# Patient Record
Sex: Female | Born: 1975 | Race: Black or African American | Hispanic: No | Smoking: Never smoker
Health system: Southern US, Community
[De-identification: ages and names within clinical notes are randomized; demographics above are authoritative.]

## PROBLEM LIST (undated history)

## (undated) DIAGNOSIS — IMO0002 Reserved for concepts with insufficient information to code with codable children: Secondary | ICD-10-CM

## (undated) DIAGNOSIS — E0500 Thyrotoxicosis with diffuse goiter without thyrotoxic crisis or storm: Secondary | ICD-10-CM

## (undated) HISTORY — PX: CHOLECYSTECTOMY: SHX55

---

## 2000-06-13 DIAGNOSIS — IMO0002 Reserved for concepts with insufficient information to code with codable children: Secondary | ICD-10-CM

## 2000-06-13 DIAGNOSIS — R87619 Unspecified abnormal cytological findings in specimens from cervix uteri: Secondary | ICD-10-CM

## 2000-06-13 HISTORY — DX: Unspecified abnormal cytological findings in specimens from cervix uteri: R87.619

## 2000-06-13 HISTORY — DX: Reserved for concepts with insufficient information to code with codable children: IMO0002

## 2004-06-13 HISTORY — PX: OOPHORECTOMY: SHX86

## 2009-02-02 ENCOUNTER — Emergency Department (HOSPITAL_BASED_OUTPATIENT_CLINIC_OR_DEPARTMENT_OTHER): Admission: EM | Admit: 2009-02-02 | Discharge: 2009-02-02 | Payer: Self-pay | Admitting: Emergency Medicine

## 2009-02-05 ENCOUNTER — Inpatient Hospital Stay (HOSPITAL_COMMUNITY): Admission: AD | Admit: 2009-02-05 | Discharge: 2009-02-05 | Payer: Self-pay | Admitting: Obstetrics & Gynecology

## 2009-03-11 ENCOUNTER — Encounter: Payer: Self-pay | Admitting: Obstetrics & Gynecology

## 2009-03-11 ENCOUNTER — Ambulatory Visit: Payer: Self-pay | Admitting: Obstetrics and Gynecology

## 2009-03-20 ENCOUNTER — Ambulatory Visit: Payer: Self-pay | Admitting: Obstetrics & Gynecology

## 2009-03-20 LAB — CONVERTED CEMR LAB
Free T4: 2.3 ng/dL — ABNORMAL HIGH (ref 0.80–1.80)
T3, Free: 7.1 pg/mL — ABNORMAL HIGH (ref 2.3–4.2)

## 2009-04-01 ENCOUNTER — Ambulatory Visit: Payer: Self-pay | Admitting: Family Medicine

## 2009-04-01 DIAGNOSIS — E0590 Thyrotoxicosis, unspecified without thyrotoxic crisis or storm: Secondary | ICD-10-CM | POA: Insufficient documentation

## 2009-04-07 ENCOUNTER — Telehealth: Payer: Self-pay | Admitting: *Deleted

## 2009-04-17 ENCOUNTER — Encounter: Payer: Self-pay | Admitting: *Deleted

## 2009-04-30 ENCOUNTER — Telehealth: Payer: Self-pay | Admitting: Family Medicine

## 2009-04-30 ENCOUNTER — Emergency Department (HOSPITAL_BASED_OUTPATIENT_CLINIC_OR_DEPARTMENT_OTHER): Admission: EM | Admit: 2009-04-30 | Discharge: 2009-04-30 | Payer: Self-pay | Admitting: Emergency Medicine

## 2009-05-20 ENCOUNTER — Telehealth: Payer: Self-pay | Admitting: Family Medicine

## 2009-05-26 ENCOUNTER — Encounter: Payer: Self-pay | Admitting: Family Medicine

## 2009-06-01 ENCOUNTER — Ambulatory Visit: Payer: Self-pay | Admitting: Family Medicine

## 2009-06-01 DIAGNOSIS — J029 Acute pharyngitis, unspecified: Secondary | ICD-10-CM

## 2009-06-01 DIAGNOSIS — R110 Nausea: Secondary | ICD-10-CM

## 2009-06-01 DIAGNOSIS — N809 Endometriosis, unspecified: Secondary | ICD-10-CM | POA: Insufficient documentation

## 2009-06-09 ENCOUNTER — Encounter (HOSPITAL_COMMUNITY): Admission: RE | Admit: 2009-06-09 | Discharge: 2009-08-17 | Payer: Self-pay | Admitting: Endocrinology

## 2009-06-13 HISTORY — PX: THYROIDECTOMY: SHX17

## 2009-06-25 ENCOUNTER — Telehealth: Payer: Self-pay | Admitting: Family Medicine

## 2009-07-17 ENCOUNTER — Inpatient Hospital Stay (HOSPITAL_COMMUNITY): Admission: RE | Admit: 2009-07-17 | Discharge: 2009-07-18 | Payer: Self-pay | Admitting: General Surgery

## 2009-09-24 ENCOUNTER — Emergency Department (HOSPITAL_BASED_OUTPATIENT_CLINIC_OR_DEPARTMENT_OTHER): Admission: EM | Admit: 2009-09-24 | Discharge: 2009-09-24 | Payer: Self-pay | Admitting: Emergency Medicine

## 2009-09-24 ENCOUNTER — Ambulatory Visit: Payer: Self-pay | Admitting: Diagnostic Radiology

## 2009-09-28 IMAGING — US US TRANSVAGINAL NON-OB
1 series · 14 of 25 positions shown · non-contrast
Comparison: None

CLINICAL DATA: Vaginal bleeding with abdominal and pelvic pain. LMP
01/22/2009.  The patient is status post left oophorectomy.

TRANSABDOMINAL AND TRANSVAGINAL ULTRASOUND OF PELVIS
TECHNIQUE: Both transabdominal and transvaginal ultrasound
examinations of the pelvis were performed including evaluation of
the uterus, ovaries, adnexal regions, and pelvic cul-de-sac.

[Series 1: us pelvis complete modify · 37 acquisitions, 14 frames shown]
[im 1/37]
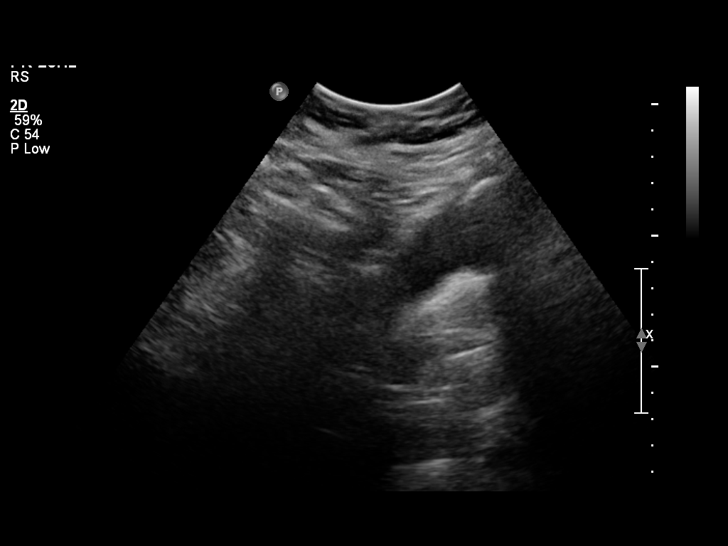
[im 4/37]
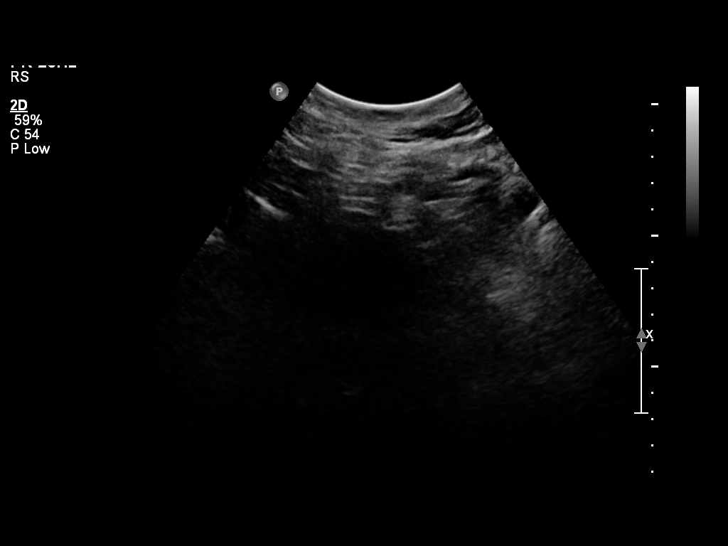
[im 7/37]
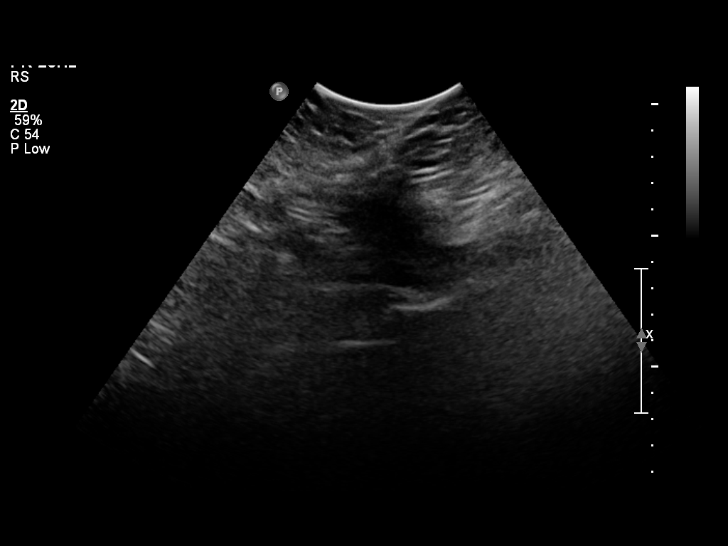
[im 10/37]
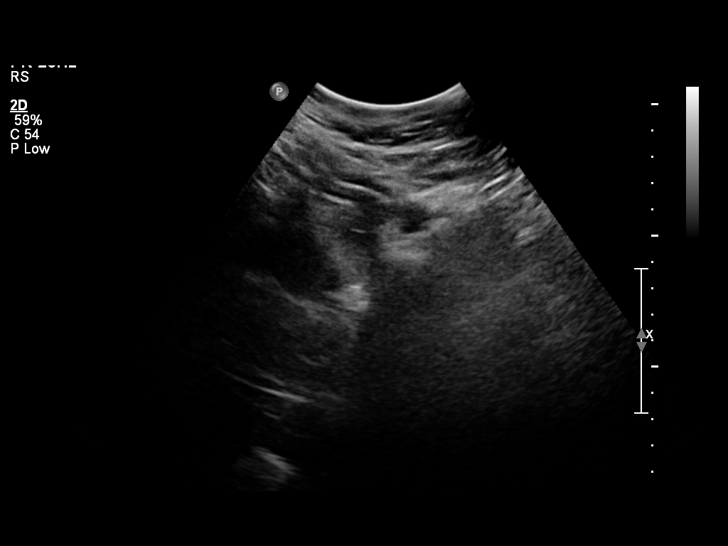
[im 13/37]
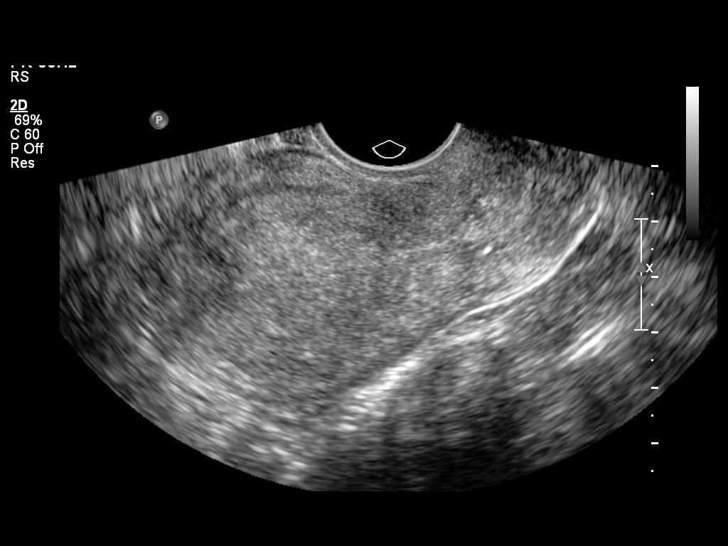
[im 14/37]
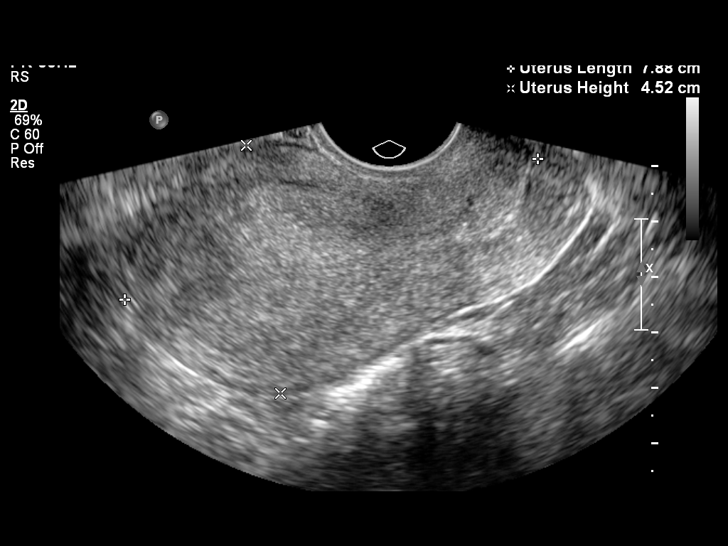
[im 17/37]
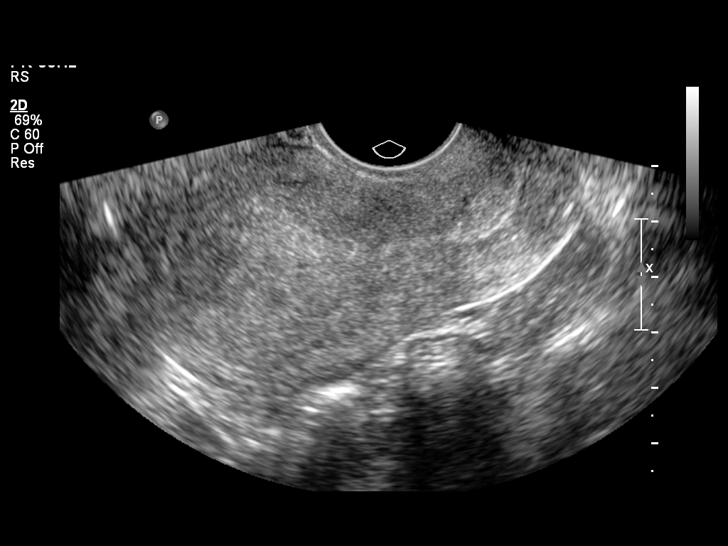
[im 20/37]
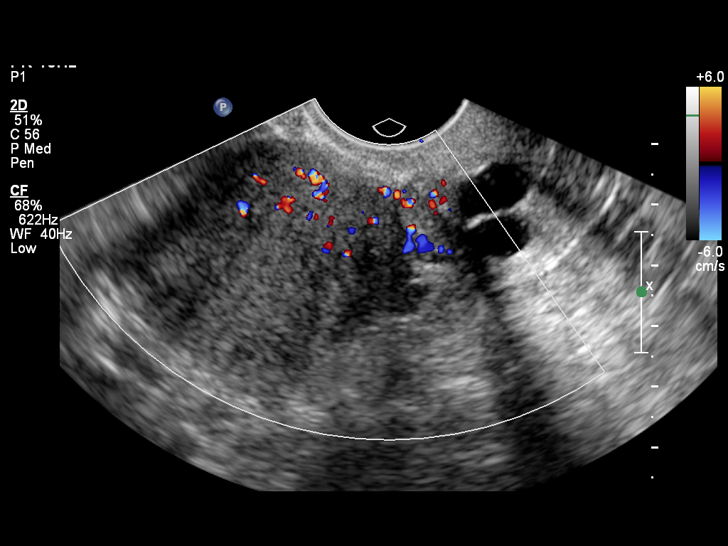
[im 23/37]
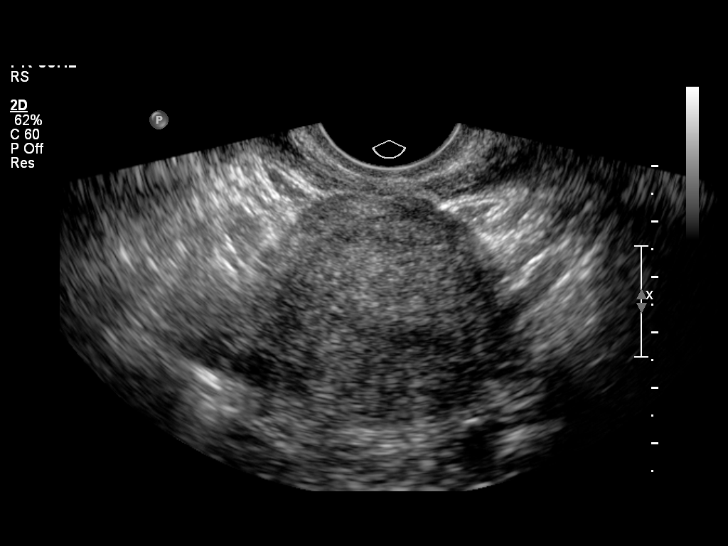
[im 25/37]
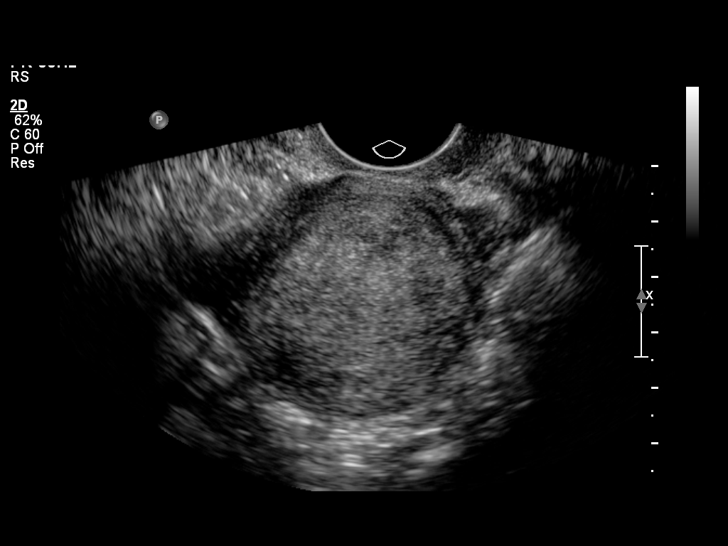
[im 28/37]
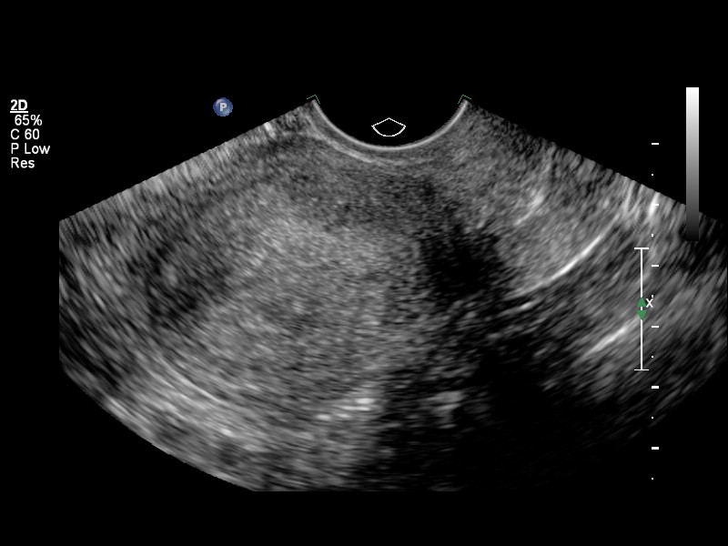
[im 31/37]
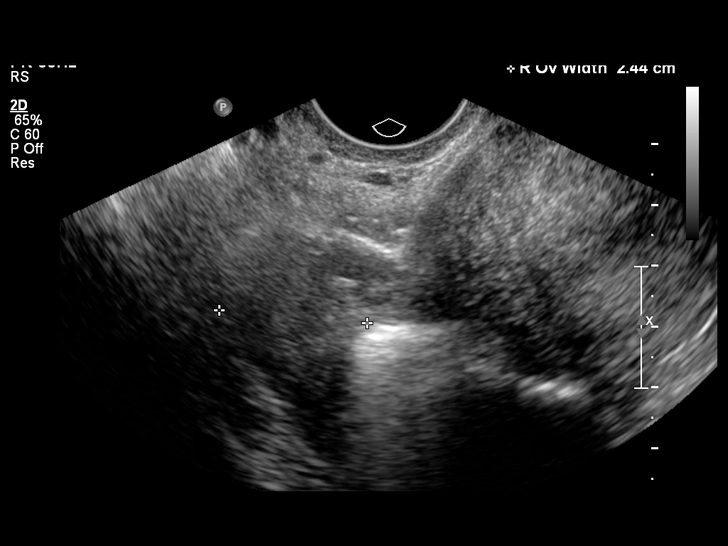
[im 34/37]
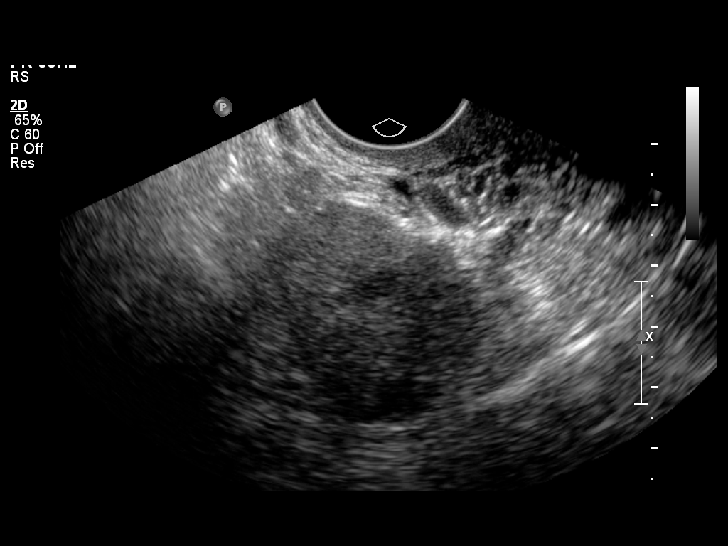
[im 37/37]
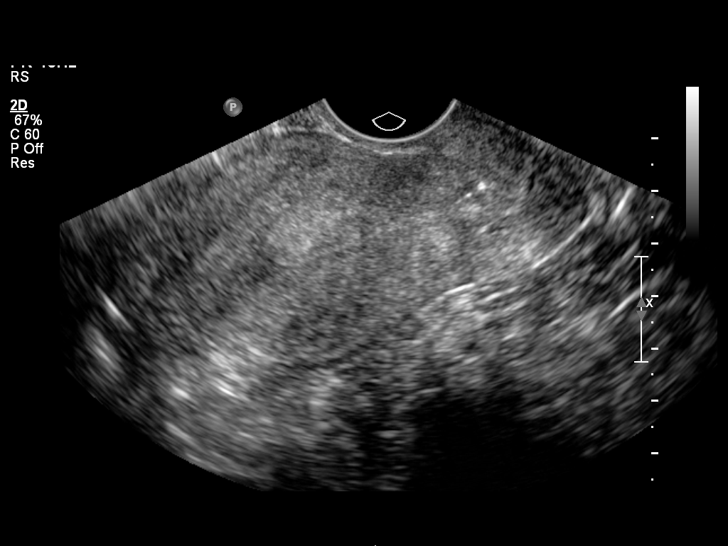

[14 of 25 positions shown; findings below may reference images not displayed]

FINDINGS: Uterus measures a sagittal length of 7.9 cm, an AP depth of 4.5 cm
and a transverse width of 4.8 cm.  The uterine myometrium is mildly
inhomogeneous but demonstrates no shadowing or mural cysts to
suggest adenomyosis otherwise.

Endometrium measures 2.8 mm in thickness.  No focal thickening or
inhomogeneity is seen

Right Ovary measures 2.5 x 3.2 x 2.4 cm and is normal in appearance

Left Ovary surgically absent

Other Findings:  No pelvic fluid is seen.
IMPRESSION: Mild diffuse uterine myometrium inhomogeneity of questionable
significance.  This could represent early changes of adenomyosis.
Normal endometrium and right ovary.

## 2009-12-11 ENCOUNTER — Telehealth: Payer: Self-pay | Admitting: Family Medicine

## 2009-12-15 ENCOUNTER — Telehealth: Payer: Self-pay | Admitting: *Deleted

## 2009-12-21 ENCOUNTER — Telehealth: Payer: Self-pay | Admitting: Family Medicine

## 2010-07-13 NOTE — Progress Notes (Signed)
Summary: cxl appt  Phone Note Call from Patient   Caller: Patient Summary of Call: pt is having a difficult time - her mom is in the hospital and cannot come today Initial call taken by: De Nurse,  December 21, 2009 8:43 AM

## 2010-07-13 NOTE — Progress Notes (Signed)
Summary: meds prob  Phone Note Call from Patient Call back at Home Phone [phone]   Caller: Patient Summary of Call: takes Zofran when she gets nauseous and it is not working - would like something different Initial call taken by: De Nurse,  June 25, 2009 11:53 AM  Follow-up for Phone Call        c/o nausea, vomitting X4 this am, wants phenergan works better for her, saw endocrinologist and had testing done dx with  graves disease ref to surg appt on 19th of Jan, [facility]  [phone] Follow-up by: Gladstone Pih,  June 25, 2009 11:59 AM  Additional Follow-up for Phone Call Additional follow up Details #1::        Sent. Additional Follow-up by: Lamar Laundry, MD January 13th, 2010 12:10PM    Additional Follow-up for Phone Call Additional follow up Details #2::    pt called and notified Rx at [facility], advised to call as needed Follow-up by: Gladstone Pih,  June 25, 2009 12:19 PM  New/Updated Medications: PROMETHAZINE HCL 12.5 MG TABS (PROMETHAZINE HCL) 1 tab by mouth as needed for nausea or vomitting Prescriptions: PROMETHAZINE HCL 12.5 MG TABS (PROMETHAZINE HCL) 1 tab by mouth as needed for nausea or vomitting  #30 x 2   Entered and Authorized by:   Ardeen Garland  MD   Signed by:   Ardeen Garland  MD on 06/25/2009   Method used:   Electronically to        [facility] [phone]* (retail)       [address]       [place], [place]  [address]       Ph: [phone]       Fax: [phone]   RxID:   [id number]

## 2010-07-13 NOTE — Consult Note (Signed)
Summary: [facility]   Imported By: Clydell Hakim 07/08/2009 15:18:02  _____________________________________________________________________  External Attachment:    Type:   Image     Comment:   External Document

## 2010-07-13 NOTE — Progress Notes (Signed)
Summary: phn msg for [facility]  Phone Note Call from Patient   Caller: Patient Summary of Call: Mom admitted to hospital this a.m. Pt not able to make it in today. Initial call taken by: Clydell Hakim,  December 15, 2009 9:48 AM

## 2010-07-13 NOTE — Progress Notes (Signed)
Summary: triage  Phone Note Call from Patient Call back at [phone]   Caller: Patient Summary of Call: Pt is feeling nauseated and thinks it is her synthroid. Initial call taken by: Clydell Hakim,  December 11, 2009 9:29 AM  Follow-up for Phone Call        LM when she calls back, we do not have her on this. she should call Dr. Talmage Nap about this med   had surgery to remove thyroid.  has been increased recently to 200mg  synthroid. nauseous since going up to 200mg . was told to see pcp by Dr. Gilman Schmidt. pt is sure it is the med. md told her it was not. pt unable to come in until next Tuesday. states she is not going to take it. told her she really should keep taking it. appt next Tues with pcp Follow-up by: Golden Circle RN,  December 11, 2009 9:38 AM

## 2010-09-01 LAB — DIFFERENTIAL
Basophils Relative: 1 % (ref 0–1)
Lymphocytes Relative: 47 % — ABNORMAL HIGH (ref 12–46)
Lymphs Abs: 3.5 10*3/uL (ref 0.7–4.0)
Monocytes Relative: 7 % (ref 3–12)
Neutro Abs: 3.3 10*3/uL (ref 1.7–7.7)

## 2010-09-01 LAB — BASIC METABOLIC PANEL
CO2: 25 mEq/L (ref 19–32)
Chloride: 105 mEq/L (ref 96–112)
GFR calc Af Amer: >60 mL/min (ref >60)
GFR calc non Af Amer: >60 mL/min (ref >60)
Sodium: 141 mEq/L (ref 135–145)

## 2010-09-01 LAB — CBC
HCT: 34.6 % — ABNORMAL LOW (ref 36.0–46.0)
MCHC: 33.3 g/dL (ref 30.0–36.0)
MCV: 82.6 fL (ref 78.0–100.0)
RBC: 4.19 MIL/uL (ref 3.87–5.11)
RDW: 17.9 % — ABNORMAL HIGH (ref 11.5–15.5)

## 2010-09-03 LAB — COMPREHENSIVE METABOLIC PANEL
Albumin: 3.6 g/dL (ref 3.5–5.2)
Alkaline Phosphatase: 55 U/L (ref 39–117)
BUN: 5 mg/dL — ABNORMAL LOW (ref 6–23)
CO2: 26 mEq/L (ref 19–32)
Chloride: 102 mEq/L (ref 96–112)
GFR calc Af Amer: >60 mL/min (ref >60)
Potassium: 3.6 mEq/L (ref 3.5–5.1)
Total Bilirubin: 0.8 mg/dL (ref 0.3–1.2)
Total Protein: 7.4 g/dL (ref 6.0–8.3)

## 2010-09-03 LAB — CALCIUM: Calcium: 8.5 mg/dL (ref 8.4–10.5)

## 2010-09-15 LAB — URINALYSIS, ROUTINE W REFLEX MICROSCOPIC
Glucose, UA: NEGATIVE mg/dL
Ketones, ur: NEGATIVE mg/dL
Nitrite: NEGATIVE
Urobilinogen, UA: 0.2 mg/dL (ref 0.0–1.0)
pH: 5.5 (ref 5.0–8.0)

## 2010-09-15 LAB — DIFFERENTIAL
Eosinophils Absolute: 0 10*3/uL (ref 0.0–0.7)
Eosinophils Relative: 1 % (ref 0–5)
Lymphs Abs: 2.1 10*3/uL (ref 0.7–4.0)
Monocytes Absolute: 0.4 10*3/uL (ref 0.1–1.0)
Monocytes Relative: 4 % (ref 3–12)
Neutrophils Relative %: 72 % (ref 43–77)

## 2010-09-15 LAB — COMPREHENSIVE METABOLIC PANEL
AST: 24 U/L (ref 0–37)
Albumin: 4 g/dL (ref 3.5–5.2)
CO2: 23 mEq/L (ref 19–32)
Calcium: 9 mg/dL (ref 8.4–10.5)
Chloride: 107 mEq/L (ref 96–112)
GFR calc non Af Amer: >60 mL/min (ref >60)
Glucose, Bld: 106 mg/dL — ABNORMAL HIGH (ref 70–99)
Total Protein: 7.6 g/dL (ref 6.0–8.3)

## 2010-09-15 LAB — CBC
Hemoglobin: 11.9 g/dL — ABNORMAL LOW (ref 12.0–15.0)
RBC: 4.64 MIL/uL (ref 3.87–5.11)
WBC: 9.9 10*3/uL (ref 4.0–10.5)

## 2010-09-15 LAB — PREGNANCY, URINE: Preg Test, Ur: NEGATIVE

## 2010-09-18 LAB — URINALYSIS, ROUTINE W REFLEX MICROSCOPIC
Hgb urine dipstick: NEGATIVE
Nitrite: NEGATIVE
Specific Gravity, Urine: 1.023 (ref 1.005–1.030)
Urobilinogen, UA: 1 mg/dL (ref 0.0–1.0)

## 2010-09-18 LAB — BASIC METABOLIC PANEL
BUN: 11 mg/dL (ref 6–23)
CO2: 24 mEq/L (ref 19–32)
Chloride: 106 mEq/L (ref 96–112)
Glucose, Bld: 126 mg/dL — ABNORMAL HIGH (ref 70–99)
Potassium: 3.7 mEq/L (ref 3.5–5.1)

## 2010-09-18 LAB — WET PREP, GENITAL
Trich, Wet Prep: NONE SEEN
Yeast Wet Prep HPF POC: NONE SEEN

## 2010-09-18 LAB — DIFFERENTIAL
Eosinophils Absolute: 0.1 10*3/uL (ref 0.0–0.7)
Monocytes Absolute: 0.4 10*3/uL (ref 0.1–1.0)
Neutro Abs: 4.1 10*3/uL (ref 1.7–7.7)
Neutrophils Relative %: 52 % (ref 43–77)

## 2010-09-18 LAB — CBC
HCT: 33.9 % — ABNORMAL LOW (ref 36.0–46.0)
HCT: 36.1 % (ref 36.0–46.0)
MCHC: 32.4 g/dL (ref 30.0–36.0)
MCV: 76.8 fL — ABNORMAL LOW (ref 78.0–100.0)
Platelets: 242 10*3/uL (ref 150–400)
Platelets: 242 10*3/uL (ref 150–400)
RDW: 15.3 % (ref 11.5–15.5)

## 2010-09-18 LAB — PREGNANCY, URINE: Preg Test, Ur: NEGATIVE

## 2010-12-24 ENCOUNTER — Inpatient Hospital Stay (HOSPITAL_COMMUNITY): Payer: BC Managed Care – PPO

## 2010-12-24 ENCOUNTER — Inpatient Hospital Stay (HOSPITAL_COMMUNITY)
Admission: AD | Admit: 2010-12-24 | Discharge: 2010-12-24 | Disposition: A | Discharge status: Home / Self Care | Payer: BC Managed Care – PPO | Source: Ambulatory Visit | Attending: Obstetrics and Gynecology | Admitting: Obstetrics and Gynecology

## 2010-12-24 ENCOUNTER — Encounter (HOSPITAL_COMMUNITY): Payer: Self-pay

## 2010-12-24 DIAGNOSIS — N949 Unspecified condition associated with female genital organs and menstrual cycle: Secondary | ICD-10-CM | POA: Insufficient documentation

## 2010-12-24 DIAGNOSIS — R102 Pelvic and perineal pain: Secondary | ICD-10-CM

## 2010-12-24 DIAGNOSIS — R52 Pain, unspecified: Secondary | ICD-10-CM

## 2010-12-24 HISTORY — DX: Reserved for concepts with insufficient information to code with codable children: IMO0002

## 2010-12-24 HISTORY — DX: Thyrotoxicosis with diffuse goiter without thyrotoxic crisis or storm: E05.00

## 2010-12-24 LAB — URINALYSIS, ROUTINE W REFLEX MICROSCOPIC
Bilirubin Urine: NEGATIVE
Glucose, UA: NEGATIVE mg/dL
Ketones, ur: NEGATIVE mg/dL
pH: 6 (ref 5.0–8.0)

## 2010-12-24 LAB — COMPREHENSIVE METABOLIC PANEL
Alkaline Phosphatase: 56 U/L (ref 39–117)
BUN: 4 mg/dL — ABNORMAL LOW (ref 6–23)
CO2: 24 mEq/L (ref 19–32)
GFR calc Af Amer: >60 mL/min (ref >60)
GFR calc non Af Amer: >60 mL/min (ref >60)
Glucose, Bld: 106 mg/dL — ABNORMAL HIGH (ref 70–99)
Potassium: 3.8 mEq/L (ref 3.5–5.1)
Total Bilirubin: 0.5 mg/dL (ref 0.3–1.2)
Total Protein: 7.8 g/dL (ref 6.0–8.3)

## 2010-12-24 LAB — CBC
Hemoglobin: 10.8 g/dL — ABNORMAL LOW (ref 12.0–15.0)
MCH: 25.6 pg — ABNORMAL LOW (ref 26.0–34.0)
MCV: 81.3 fL (ref 78.0–100.0)
RBC: 4.22 MIL/uL (ref 3.87–5.11)

## 2010-12-24 LAB — DIFFERENTIAL
Eosinophils Absolute: 0.1 10*3/uL (ref 0.0–0.7)
Eosinophils Relative: 1 % (ref 0–5)
Lymphocytes Relative: 46 % (ref 12–46)
Lymphs Abs: 3.4 10*3/uL (ref 0.7–4.0)
Monocytes Relative: 7 % (ref 3–12)
Neutrophils Relative %: 46 % (ref 43–77)

## 2010-12-24 LAB — WET PREP, GENITAL: Trich, Wet Prep: NONE SEEN

## 2010-12-24 MED ORDER — HYDROMORPHONE HCL 1 MG/ML IJ SOLN
1.0000 mg | Freq: Once | INTRAMUSCULAR | Status: AC
  Administered 2010-12-24: 1 mg via INTRAVENOUS
  Filled 2010-12-24: qty 1

## 2010-12-24 MED ORDER — SODIUM CHLORIDE 0.9 % IV SOLN: INTRAVENOUS | Status: DC

## 2010-12-24 MED ORDER — SODIUM CHLORIDE 0.9 % IV SOLN
Freq: Once | INTRAVENOUS | Status: AC
  Administered 2010-12-24: 15:00:00 via INTRAVENOUS

## 2010-12-24 MED ORDER — KETOROLAC TROMETHAMINE 10 MG PO TABS: 10.0000 mg | ORAL_TABLET | Freq: Four times a day (QID) | ORAL | Status: AC | PRN

## 2010-12-24 MED ORDER — PROMETHAZINE HCL 25 MG PO TABS: 25.0000 mg | ORAL_TABLET | Freq: Four times a day (QID) | ORAL | Status: DC | PRN

## 2010-12-24 MED ORDER — PROMETHAZINE HCL 25 MG/ML IJ SOLN
12.5000 mg | Freq: Once | INTRAMUSCULAR | Status: AC
  Administered 2010-12-24: 12.5 mg via INTRAVENOUS
  Filled 2010-12-24: qty 1

## 2010-12-24 MED ORDER — KETOROLAC TROMETHAMINE 30 MG/ML IJ SOLN
30.0000 mg | Freq: Once | INTRAMUSCULAR | Status: AC
  Administered 2010-12-24: 30 mg via INTRAVENOUS
  Filled 2010-12-24: qty 1

## 2010-12-24 MED ORDER — ONDANSETRON HCL 4 MG/2ML IJ SOLN
4.0000 mg | Freq: Once | INTRAMUSCULAR | Status: AC
  Administered 2010-12-24: 4 mg via INTRAVENOUS
  Filled 2010-12-24: qty 2

## 2010-12-24 NOTE — ED Provider Notes (Signed)
History     Chief Complaint  Patient presents with  . Pelvic Pain   The history is provided by the patient.    OB History    Grav Para Term Preterm Abortions TAB SAB Ect Mult Living   3 2   1  1   2       Past Medical History  Diagnosis Date  . Grave's disease   . Abnormal Pap smear 2002    mild dysplasia  . Endometriosis     Past Surgical History  Procedure Date  . Thyroidectomy 2011  . Oophorectomy 2006    left ovary    Family History  Problem Relation Age of Onset  . Heart disease Mother   . Heart disease Father   . Early death Father     History  Substance Use Topics  . Smoking status: Never Smoker   . Smokeless tobacco: Never Used  . Alcohol Use: Yes     socially    Allergies:  Allergies  Allergen Reactions  . Codeine Anaphylaxis    Prescriptions prior to admission  Medication Sig Dispense Refill  . promethazine (PHENERGAN) 12.5 MG tablet Take 12.5 mg by mouth as needed. For nausea and vomitting        . zolpidem (AMBIEN) 5 MG tablet Take 5 mg by mouth at bedtime as needed.          Review of Systems  Constitutional: Positive for chills and malaise/fatigue. Negative for fever.  Eyes: Negative for blurred vision and double vision.  Cardiovascular: Negative for chest pain and palpitations.  Gastrointestinal: Positive for nausea and abdominal pain.  Genitourinary: Negative for dysuria, urgency and frequency.  Musculoskeletal: Positive for back pain.  Skin: Negative.   Neurological: Positive for dizziness, tingling and headaches.  Psychiatric/Behavioral: Negative for depression.  GU; Pelvic pain that is severe Physical Exam   Temperature 98.3 F (36.8 C), temperature source Oral, resp. rate 18, last menstrual period 11/23/2010.  Physical Exam  General Appearance:    Alert, cooperative, no distress, appears stated age  Head:    Normocephalic, without obvious abnormality, atraumatic  Back:     Symmetric, no curvature, ROM normal, no CVA  tenderness  Lungs:      respirations unlabored  Genitalia:    Normal female without lesions, thick white d/c. +CMT, left adnexal tenderness, no palpable enlargement of the uterus.  Abdomen: Soft, tender on palpation LLQ, no rebound or guarding.   Extremities:   Extremities normal, atraumatic, no cyanosis or edema  Skin:   Skin color, texture, turgor normal, no rashes or lesions  Neurologic: Alert and oriented, normal gait.    MAU Course  Procedures  MDM    Kerrie Buffalo, NP 12/24/10 2029

## 2010-12-24 NOTE — ED Provider Notes (Signed)
Patient has received I.v. Dilaudid and Toradol with some relief, and some dizziness attributed to the i.v. Dilaudid.  Patient desires to try going home on other analgesic plus antiemetic plus p.o toradol.  Pt desires to return to work Monday and will receive note confirming this to be allowed.  Followup Tuesday with DR. Neva Seat in [place].

## 2010-12-24 NOTE — ED Notes (Signed)
Dr. Emelda Fear at pt. bedside with Mayer Camel, NP.

## 2010-12-24 NOTE — Progress Notes (Signed)
Pt transported to ultrasound. Ultrasound tech informed that pt had been given pain medicine.

## 2010-12-24 NOTE — Progress Notes (Addendum)
Patient gives a reliable history of progressive llq pain worsening as menses approaches, with menses due soon. History of left salping oophorectomy as well as 3 laparoscopies for endometriosis.  Pain has previously been mainly AFTER her menses. Phys Ex notable for normal bowel sounds, soft abdomen, and pain in anterior thigh with leg lift , less with external rotation. No pain in back of leg.  Calf and thigh normal to palpation. Pelvic not repeated, but ultrasound indicates tissue in area of left adnexa, suggestive of recurrent endometriosis. Pt also c/o dizziness earlier today, and mouth dry.  Poor PO intake today. Normal BM earlier.  Imp:  Recurrent endometriosis with referred pain into thigh. Mild dehydration and assoc dizziness .\  Plan:  Tx with i.v. Toradol 30 mg I.v. And Dilaudid 1 mg repeat dose, and reassess.     Goal : pain control until follow up appt with Dr. Neva Seat in [facility].  See note in Progress notes:  Patient slightly improved, desires to go home and follow up next week with dr Neva Seat.

## 2010-12-25 LAB — GC/CHLAMYDIA PROBE AMP, GENITAL: GC Probe Amp, Genital: NEGATIVE

## 2011-08-16 IMAGING — US US PELVIS COMPLETE
1 series · 13 of 25 positions shown · non-contrast
Comparison: 02/05/2009

CLINICAL DATA: Left lower quadrant pain radiating to the back with
leg numbness and dark urine.  Endometriosis.  LMP 11/25/2010

TRANSABDOMINAL AND TRANSVAGINAL ULTRASOUND OF PELVIS
TECHNIQUE: Both transabdominal and transvaginal ultrasound
examinations of the pelvis were performed. Transabdominal technique
was performed for global imaging of the pelvis including uterus,
ovaries, adnexal regions, and pelvic cul-de-sac.

[Series 1: us pelvis complete · 47 acquisitions, 13 frames shown]
[im 1/47]
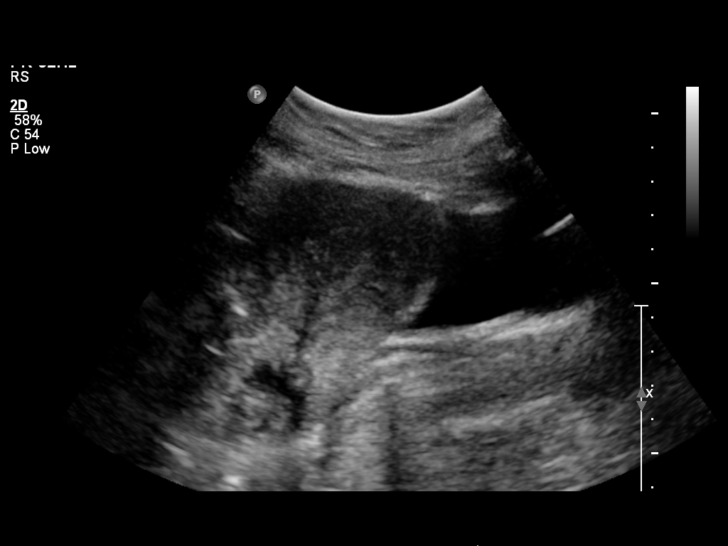
[im 4/47]
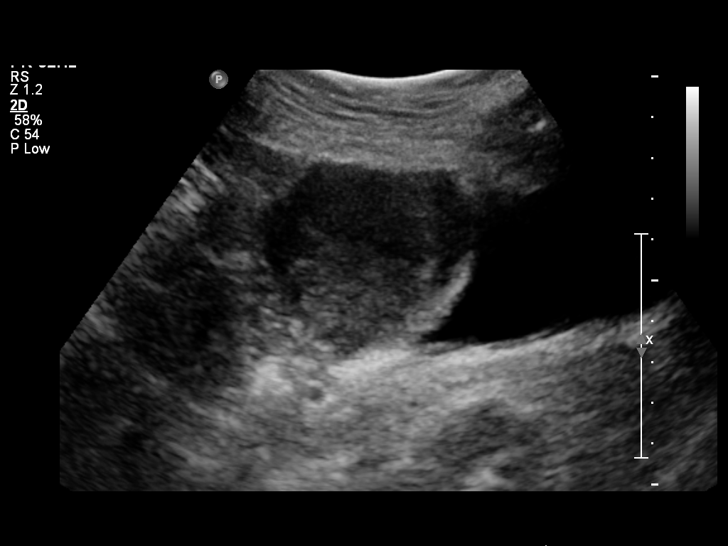
[im 8/47]
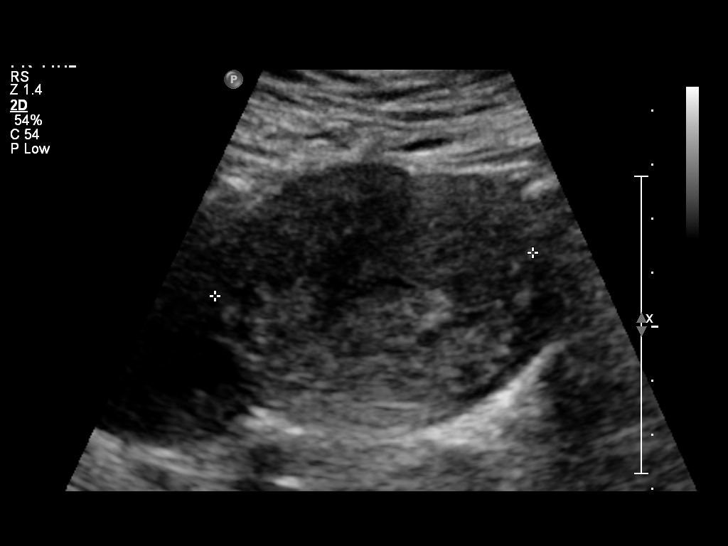
[im 12/47]
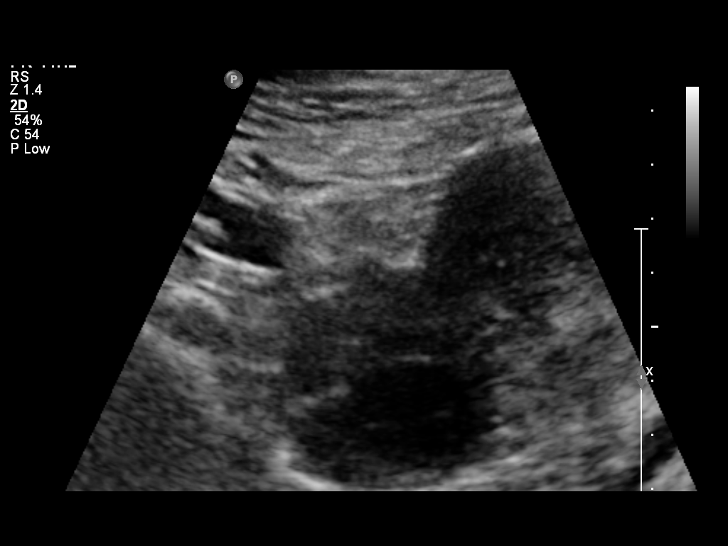
[im 16/47]
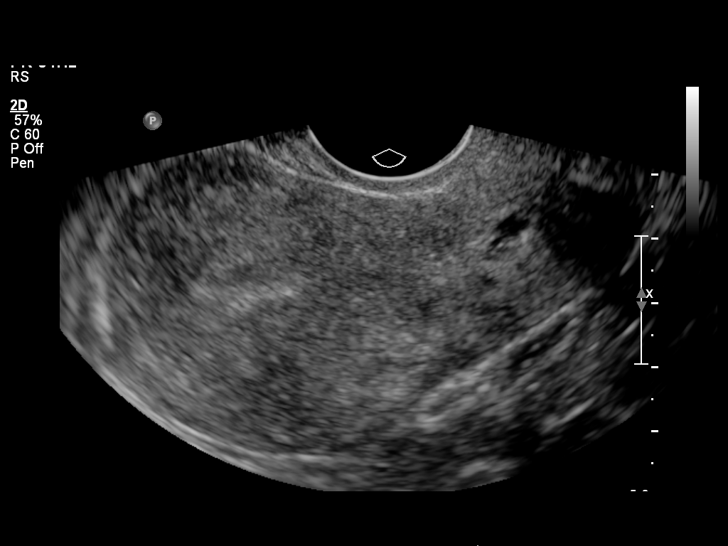
[im 20/47]
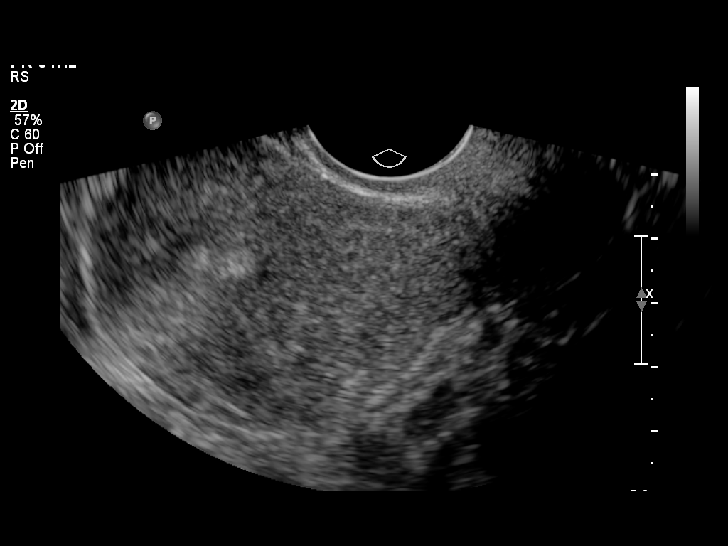
[im 24/47]
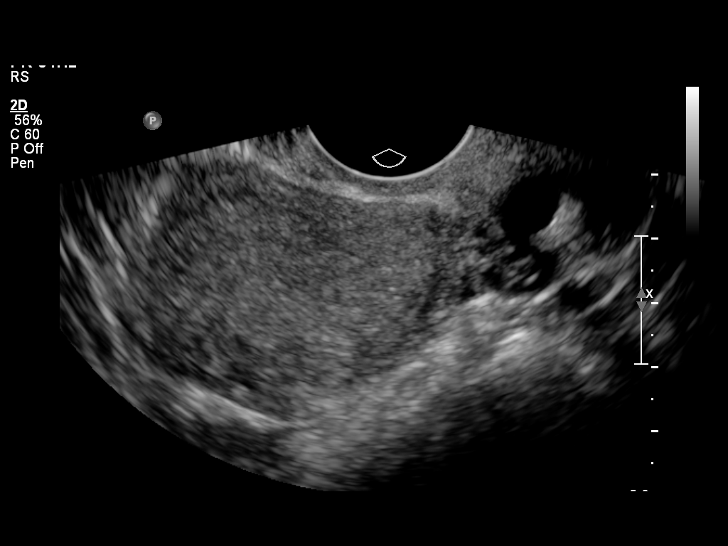
[im 27/47]
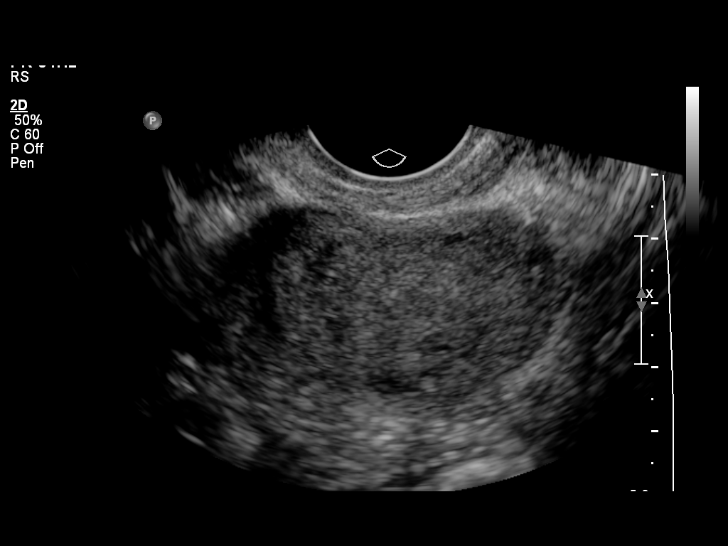
[im 31/47]
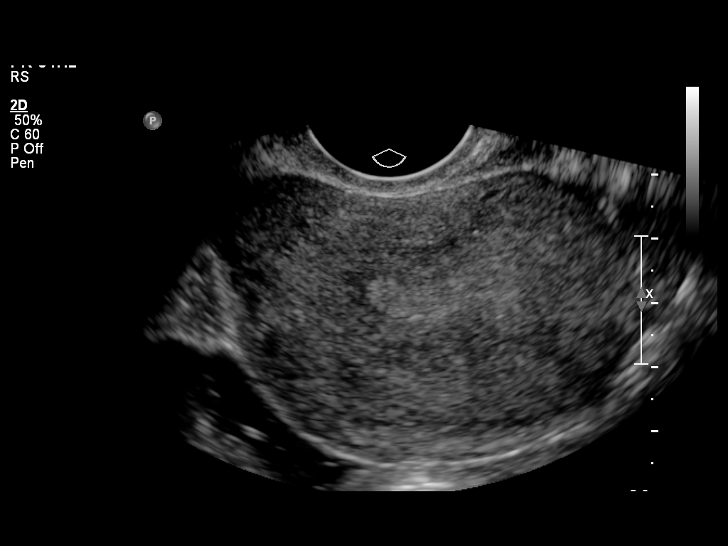
[im 35/47]
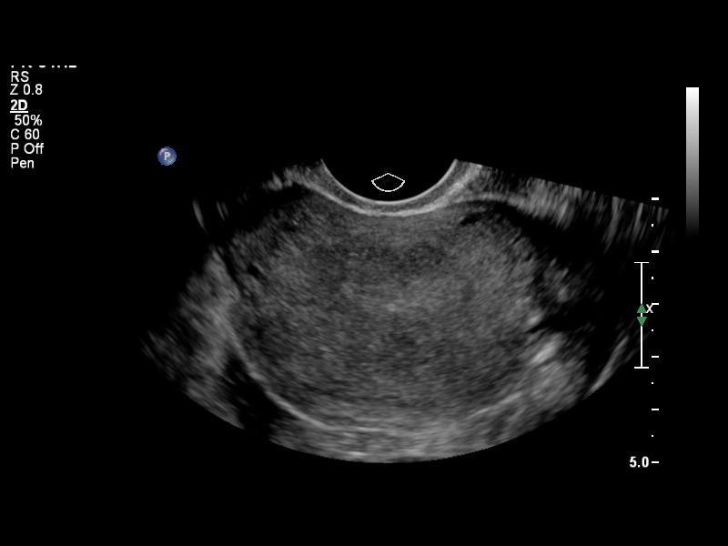
[im 39/47]
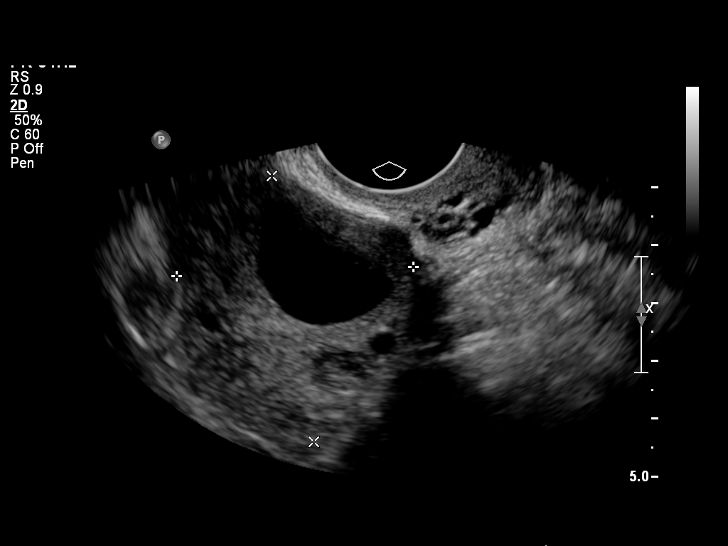
[im 43/47]
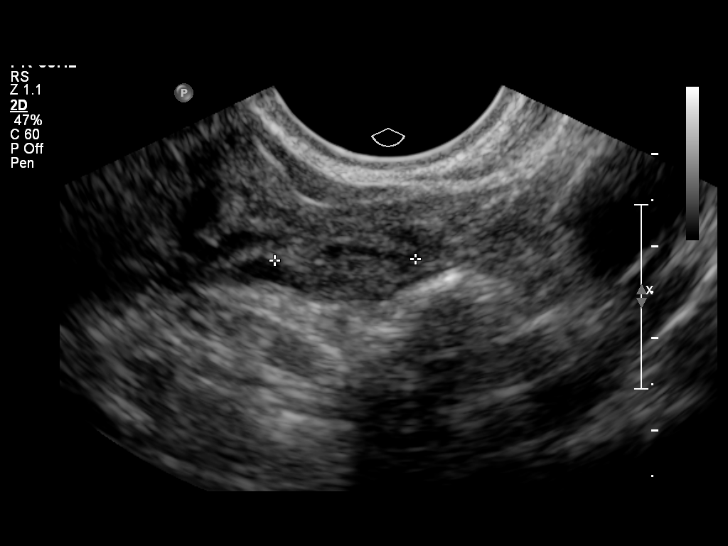
[im 47/47]
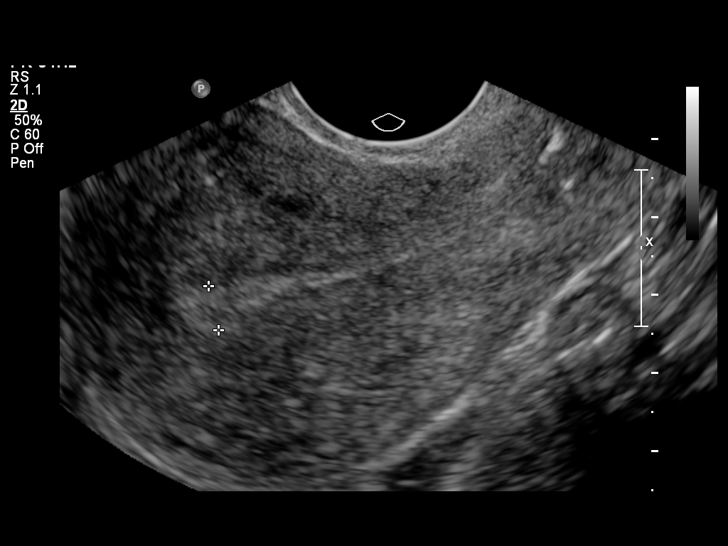

[13 of 25 positions shown; findings below may reference images not displayed]

It was necessary to proceed with endovaginal exam following the
transabdomnial exam to visualize the endometrium and ovaries.
FINDINGS: Uterus: The uterus demonstrates a sagittal length of 8.0 cm, AP
depth of 5.1 cm and a transverse width 6.1 cm.  The uterine
myometrium is mildly heterogeneous with no focal abnormality.

Endometrium: Is homogeneously echogenic with an AP width of 5.8 mm.
No areas of focal thickening or heterogeneity are seen

Right ovary:  Measures 3.7 by a 4.1 x 4.7 cm and contains a thin
walled cystic area measuring 3.2 by 2.4 x 2.0 cm which is either a
single cyst with a solitary thin septation measuring 1.7mm or two
closely apposed follicles.  No worrisome features are seen and this
is likely a benign or functional process.

Left ovary: Measures 1.5 by by 1.5 x 0.8 cm and has a normal
appearance.

Other findings: No pelvic fluid or separate adnexal masses are
seen.  No soft tissue nodularity is identified to suggest
endometrial implants in this patient with known endometriosis.
IMPRESSION: Mild myometrial heterogeneity with no focal abnormality or
ancillary ultrasound findings to suggest underlying adenomyosis.

Thin endometrial stripe and normal left ovary.

Thin walled right ovarian cyst with solitary thin septation versus
two adjacent closely apposed follicles.  This is likely functional
but follow-up is recommended in the postsecretory phase of the
cycle following the next complete cycle to confirm resolution.

No explanation for the patient's severe pelvic pain is identified.

## 2012-04-08 ENCOUNTER — Emergency Department (HOSPITAL_BASED_OUTPATIENT_CLINIC_OR_DEPARTMENT_OTHER)
Admission: EM | Admit: 2012-04-08 | Discharge: 2012-04-08 | Disposition: A | Discharge status: Home / Self Care | Payer: BC Managed Care – PPO | Attending: Emergency Medicine | Admitting: Emergency Medicine

## 2012-04-08 ENCOUNTER — Encounter (HOSPITAL_BASED_OUTPATIENT_CLINIC_OR_DEPARTMENT_OTHER): Payer: Self-pay | Admitting: *Deleted

## 2012-04-08 DIAGNOSIS — Z79899 Other long term (current) drug therapy: Secondary | ICD-10-CM | POA: Insufficient documentation

## 2012-04-08 DIAGNOSIS — Z8639 Personal history of other endocrine, nutritional and metabolic disease: Secondary | ICD-10-CM | POA: Insufficient documentation

## 2012-04-08 DIAGNOSIS — E0500 Thyrotoxicosis with diffuse goiter without thyrotoxic crisis or storm: Secondary | ICD-10-CM | POA: Insufficient documentation

## 2012-04-08 DIAGNOSIS — N809 Endometriosis, unspecified: Secondary | ICD-10-CM | POA: Insufficient documentation

## 2012-04-08 DIAGNOSIS — Z862 Personal history of diseases of the blood and blood-forming organs and certain disorders involving the immune mechanism: Secondary | ICD-10-CM | POA: Insufficient documentation

## 2012-04-08 DIAGNOSIS — Z9079 Acquired absence of other genital organ(s): Secondary | ICD-10-CM | POA: Insufficient documentation

## 2012-04-08 DIAGNOSIS — M461 Sacroiliitis, not elsewhere classified: Secondary | ICD-10-CM | POA: Insufficient documentation

## 2012-04-08 DIAGNOSIS — Z8742 Personal history of other diseases of the female genital tract: Secondary | ICD-10-CM | POA: Insufficient documentation

## 2012-04-08 DIAGNOSIS — R109 Unspecified abdominal pain: Secondary | ICD-10-CM | POA: Insufficient documentation

## 2012-04-08 LAB — PREGNANCY, URINE: Preg Test, Ur: NEGATIVE

## 2012-04-08 LAB — URINALYSIS, ROUTINE W REFLEX MICROSCOPIC
Bilirubin Urine: NEGATIVE
Glucose, UA: NEGATIVE mg/dL
Hgb urine dipstick: NEGATIVE
Specific Gravity, Urine: 1.026 (ref 1.005–1.030)
Urobilinogen, UA: 0.2 mg/dL (ref 0.0–1.0)

## 2012-04-08 LAB — URINE MICROSCOPIC-ADD ON

## 2012-04-08 MED ORDER — GI COCKTAIL ~~LOC~~
30.0000 mL | Freq: Once | ORAL | Status: AC
  Administered 2012-04-08: 30 mL via ORAL
  Filled 2012-04-08: qty 30

## 2012-04-08 MED ORDER — HYDROCODONE-ACETAMINOPHEN 5-325 MG PO TABS: 1.0000 | ORAL_TABLET | Freq: Four times a day (QID) | ORAL | Status: DC | PRN

## 2012-04-08 MED ORDER — NAPROXEN SODIUM 220 MG PO TABS: ORAL_TABLET | ORAL | Status: DC

## 2012-04-08 NOTE — ED Provider Notes (Signed)
History   This chart was scribed for Hanley Seamen, MD by Sofie Rower. The patient was seen in room MH04/MH04 and the patient's care was started at 11:04PM.     CSN: [id number]  Arrival date & time 04/08/12  2122   First MD Initiated Contact with Patient 04/08/12 2304      Chief Complaint  Patient presents with  . Back Pain    (Consider location/radiation/quality/duration/timing/severity/associated sxs/prior treatment) Patient is a 36 y.o. female presenting with back pain. The history is provided by the patient. No language interpreter was used.  Back Pain  This is a new problem. The current episode started more than 2 days ago (One week ago. ). The problem occurs constantly. The problem has been gradually worsening. The pain is associated with no known injury. The pain is present in the sacro-iliac joint. The quality of the pain is described as stabbing and shooting. The pain radiates to the left thigh. The pain is moderate. The symptoms are aggravated by certain positions and bending. The pain is the same all the time. Associated symptoms include chest pain, abdominal pain and tingling. Pertinent negatives include no dysuria. She has tried NSAIDs for the symptoms. The treatment provided no relief.    Patricia Nolan is a 35 y.o. female who presents to the Emergency Department complaining of  sudden, progressively worsening, back pain located at the left lower back, radiating towards the left lower extremity, onset one week ago.  Associated symptoms include chest pain, groin pain located at the left groin and numbness located at the left lower extremity. Modifying factors include walking which intensifies the back and leg pain and deep breathing which intensifies the chest pain. The pt has a hx of graves disease, endometriosis, and thyroidectomy.   The pt denies dysuria, fever, chills, nausea, vomiting, and diarrhea.   The pt does not smoke, however, she does drink alcohol socially.   PCP is  Dr. Julio Sicks.    Past Medical History  Diagnosis Date  . Grave's disease   . Abnormal Pap smear 2002    mild dysplasia  . Endometriosis     Past Surgical History  Procedure Date  . Thyroidectomy 2011  . Oophorectomy 2006    left ovary    Family History  Problem Relation Age of Onset  . Heart disease Mother   . Heart disease Father   . Early death Father     History  Substance Use Topics  . Smoking status: Never Smoker   . Smokeless tobacco: Never Used  . Alcohol Use: Yes     socially    OB History    Grav Para Term Preterm Abortions TAB SAB Ect Mult Living   3 2 2  1  1   2       Review of Systems  Cardiovascular: Positive for chest pain.  Gastrointestinal: Positive for abdominal pain.  Genitourinary: Negative for dysuria.  Musculoskeletal: Positive for back pain.  Neurological: Positive for tingling.  All other systems reviewed and are negative.    Allergies  Codeine  Home Medications   Current Outpatient Rx  Name Route Sig Dispense Refill  . IBUPROFEN 800 MG PO TABS Oral Take 800 mg by mouth every 8 (eight) hours as needed.    Marland Kitchen DIMENHYDRINATE 50 MG PO TABS Oral Take 50 mg by mouth every 8 (eight) hours as needed.      Marland Kitchen LEVOTHYROXINE SODIUM 112 MCG PO TABS Oral Take 112 mcg by mouth daily.      Marland Kitchen  PROMETHAZINE HCL 12.5 MG PO TABS Oral Take 12.5 mg by mouth as needed. For nausea and vomitting      . PROMETHAZINE HCL 25 MG PO TABS Oral Take 1 tablet (25 mg total) by mouth every 6 (six) hours as needed for nausea. 30 tablet 0  . ZOLPIDEM TARTRATE 5 MG PO TABS Oral Take 5 mg by mouth at bedtime as needed.        BP 147/80  Pulse 101  Temp 98.7 F (37.1 C) (Oral)  Resp 16  SpO2 100%  LMP 04/01/2012  Physical Exam  Nursing note and vitals reviewed. Constitutional: She is oriented to person, place, and time. She appears well-developed and well-nourished.  HENT:  Head: Atraumatic.  Nose: Nose normal.  Eyes: Conjunctivae normal and EOM are  normal. Pupils are equal, round, and reactive to light.  Neck: Normal range of motion. Neck supple.  Cardiovascular: Normal rate, regular rhythm and normal heart sounds.   Pulmonary/Chest: Effort normal and breath sounds normal.  Abdominal: Soft. Bowel sounds are normal.  Musculoskeletal: Normal range of motion. She exhibits tenderness.       Left sacro illiac tenderness. Left groin tenderness.   Neurological: She is alert and oriented to person, place, and time.  Skin: Skin is warm and dry.  Psychiatric: She has a normal mood and affect. Her behavior is normal.    ED Course  Procedures (including critical care time)  DIAGNOSTIC STUDIES: Oxygen Saturation is 100% on room air, normal by my interpretation.    COORDINATION OF CARE:    11:11 PM- Treatment plan concerning pain management discussed with patient. Pt agrees with treatment.     MDM   Nursing notes and vitals signs, including pulse oximetry, reviewed.  Summary of this visit's results, reviewed by myself:  Labs:  Results for orders placed during the hospital encounter of 04/08/12  PREGNANCY, URINE      Component Value Range   Preg Test, Ur NEGATIVE  NEGATIVE  URINALYSIS, ROUTINE W REFLEX MICROSCOPIC      Component Value Range   Color, Urine YELLOW  YELLOW   APPearance CLOUDY (*) CLEAR   Specific Gravity, Urine 1.026  1.005 - 1.030   pH 6.0  5.0 - 8.0   Glucose, UA NEGATIVE  NEGATIVE mg/dL   Hgb urine dipstick NEGATIVE  NEGATIVE   Bilirubin Urine NEGATIVE  NEGATIVE   Ketones, ur NEGATIVE  NEGATIVE mg/dL   Protein, ur NEGATIVE  NEGATIVE mg/dL   Urobilinogen, UA 0.2  0.0 - 1.0 mg/dL   Nitrite POSITIVE (*) NEGATIVE   Leukocytes, UA SMALL (*) NEGATIVE  URINE MICROSCOPIC-ADD ON      Component Value Range   Squamous Epithelial / LPF MANY (*) RARE   WBC, UA 3-6  <3 WBC/hpf   RBC / HPF 0-2  <3 RBC/hpf   Bacteria, UA MANY (*) RARE     Date: 04/08/2012 9:42 PM  Rate: 98  Rhythm: normal sinus rhythm  QRS Axis:  normal  Intervals: normal  ST/T Wave abnormalities: normal  Conduction Disutrbances: none  Narrative Interpretation: unremarkable  Comparison with previous EKG:   11:29 PM Patient states she continues to have a burning sensation when she swallows even after taking a GI cocktail. This appears to be consistent with acid reflux due to to high-dose ibuprofen. We will have her switch to Aleve and advised she take it with meals. Her history and exam are consistent with sacroiliitis.         I personally  performed the services described in this documentation, which was scribed in my presence.  The recorded information has been reviewed and considered.    Hanley Seamen, MD 04/08/12 2329

## 2012-04-08 NOTE — ED Notes (Signed)
Pt reports chest pain worse with inspiration radiating to back since 6 pm- pt reports LLQ abd pain x 1 week- states left leg "feels like its falling asleep"

## 2012-04-10 LAB — URINE CULTURE

## 2012-04-11 NOTE — ED Notes (Signed)
rec'd call from pt requesting urine culture results, pt positive for e-coli per urine culture results. MD made aware and new orders rec'd. Rx for Keflex 500 mg, 1 tab PO QID for 7 days disp #28 per Dr Bebe Shaggy. Pt made aware of new rx and [facility] pt request.

## 2012-04-26 NOTE — ED Notes (Signed)
04/25/12 [facility] @ [phone] called for clarification of quantity & refill of  Naproxen Sodium 220 mg tablet. Trisha Mangle PA wrote Sig #30  2 tablets po bid prn pain refill x 1. Information shared with pharmacy.

## 2012-07-12 ENCOUNTER — Encounter: Payer: Self-pay | Admitting: Internal Medicine

## 2012-07-12 ENCOUNTER — Ambulatory Visit (INDEPENDENT_AMBULATORY_CARE_PROVIDER_SITE_OTHER): Payer: BC Managed Care – PPO | Admitting: Internal Medicine

## 2012-07-12 VITALS — BP 128/84 | HR 81 | Temp 97.0°F | Resp 18 | Ht 66.0 in | Wt 221.0 lb

## 2012-07-12 DIAGNOSIS — E890 Postprocedural hypothyroidism: Secondary | ICD-10-CM | POA: Insufficient documentation

## 2012-07-12 DIAGNOSIS — Z862 Personal history of diseases of the blood and blood-forming organs and certain disorders involving the immune mechanism: Secondary | ICD-10-CM

## 2012-07-12 DIAGNOSIS — Z139 Encounter for screening, unspecified: Secondary | ICD-10-CM

## 2012-07-12 DIAGNOSIS — Z8639 Personal history of other endocrine, nutritional and metabolic disease: Secondary | ICD-10-CM

## 2012-07-12 DIAGNOSIS — R002 Palpitations: Secondary | ICD-10-CM

## 2012-07-12 DIAGNOSIS — Z9889 Other specified postprocedural states: Secondary | ICD-10-CM

## 2012-07-12 LAB — COMPREHENSIVE METABOLIC PANEL
CO2: 24 mEq/L (ref 19–32)
Calcium: 8.7 mg/dL (ref 8.4–10.5)
Chloride: 104 mEq/L (ref 96–112)
Creat: 0.87 mg/dL (ref 0.50–1.10)
Glucose, Bld: 86 mg/dL (ref 70–99)
Total Bilirubin: 0.3 mg/dL (ref 0.3–1.2)

## 2012-07-12 LAB — LIPID PANEL
Cholesterol: 174 mg/dL (ref 0–200)
Total CHOL/HDL Ratio: 3.8 Ratio
Triglycerides: 52 mg/dL (ref <150)
VLDL: 10 mg/dL (ref 0–40)

## 2012-07-12 LAB — CBC WITH DIFFERENTIAL/PLATELET
Basophils Absolute: 0 10*3/uL (ref 0.0–0.1)
Basophils Relative: 0 % (ref 0–1)
Eosinophils Absolute: 0.1 10*3/uL (ref 0.0–0.7)
Eosinophils Relative: 1 % (ref 0–5)
HCT: 33.7 % — ABNORMAL LOW (ref 36.0–46.0)
Hemoglobin: 10.9 g/dL — ABNORMAL LOW (ref 12.0–15.0)
Lymphocytes Relative: 36 % (ref 12–46)
Lymphs Abs: 2.6 10*3/uL (ref 0.7–4.0)
MCH: 26 pg (ref 26.0–34.0)
MCHC: 32.3 g/dL (ref 30.0–36.0)
MCV: 80.2 fL (ref 78.0–100.0)
Monocytes Absolute: 0.5 10*3/uL (ref 0.1–1.0)
Monocytes Relative: 6 % (ref 3–12)
Neutro Abs: 4 10*3/uL (ref 1.7–7.7)
Neutrophils Relative %: 57 % (ref 43–77)
Platelets: 291 10*3/uL (ref 150–400)
RBC: 4.2 MIL/uL (ref 3.87–5.11)
RDW: 15.6 % — ABNORMAL HIGH (ref 11.5–15.5)
WBC: 7.2 10*3/uL (ref 4.0–10.5)

## 2012-07-12 MED ORDER — LEVOTHYROXINE SODIUM 150 MCG PO TABS: 150.0000 ug | ORAL_TABLET | Freq: Every day | ORAL | Status: DC

## 2012-07-12 NOTE — Progress Notes (Signed)
Subjective:    Patient ID: Patricia Nolan, female    DOB: Mar 11, 1976, 37 y.o.   MRN: [id number]  HPI New pt here for first visit.  Former care Dr. Leanor Kail  PMH of Graves disease s/P total thyroidectomy in 2011,  Cervical dysplasia,   History of anemia,  History of Vitamin b12 deficinecy, and history of endometriosis  She reports she has been out of her Synthroid for about 5 days.  She reports  She has had heart palpitations last few days.  She does not drink caffeine.  No chest pain, SOB or syncope symptoms.   She is wondering if it is her thyroid  She is going to see her GYN   Dr. Burnett Sheng this afternoon about her endometriosis.  He does her pap smears  Allergies  Allergen Reactions  . Codeine Anaphylaxis   Past Medical History  Diagnosis Date  . Grave's disease   . Abnormal Pap smear 2002    mild dysplasia  . Endometriosis    Past Surgical History  Procedure Date  . Thyroidectomy 2011  . Oophorectomy 2006    left ovary  . Cholecystectomy    History   Social History  . Marital Status: Divorced    Spouse Name: N/A    Number of Children: N/A  . Years of Education: N/A   Occupational History  . Not on file.   Social History Main Topics  . Smoking status: Never Smoker   . Smokeless tobacco: Never Used  . Alcohol Use: Yes     Comment: socially  . Drug Use: No  . Sexually Active: No   Other Topics Concern  . Not on file   Social History Narrative  . No narrative on file   Family History  Problem Relation Age of Onset  . Heart disease Mother   . Heart disease Father   . Early death Father    Patient Active Problem List  Diagnosis  . HYPERTHYROIDISM  . SORE THROAT  . ENDOMETRIOSIS  . NAUSEA  . S/P complete thyroidectomy  . History of Graves' disease  . Heart palpitations  . History of non anemic vitamin B12 deficiency   Current Outpatient Prescriptions on File Prior to Visit  Medication Sig Dispense Refill  . dimenhyDRINATE (DRAMAMINE) 50 MG tablet  Take 50 mg by mouth every 8 (eight) hours as needed.        Marland Kitchen ibuprofen (ADVIL,MOTRIN) 800 MG tablet Take 800 mg by mouth every 8 (eight) hours as needed.      . naproxen sodium (ALEVE) 220 MG tablet Take 2 tablets twice a day as needed for back and groin pain. Best taken with a meal.      . promethazine (PHENERGAN) 12.5 MG tablet Take 12.5 mg by mouth as needed. For nausea and vomitting        . promethazine (PHENERGAN) 25 MG tablet Take 1 tablet (25 mg total) by mouth every 6 (six) hours as needed for nausea.  30 tablet  0  . zolpidem (AMBIEN) 5 MG tablet Take 5 mg by mouth at bedtime as needed.             Review of Systems See HPI    Objective:   Physical Exam Physical Exam  Nursing note and vitals reviewed.  Constitutional: She is oriented to person, place, and time. She appears well-developed and well-nourished.  HENT:  Head: Normocephalic and atraumatic.  Cardiovascular: Normal rate and regular rhythm. Exam reveals no gallop and no friction  rub.  No murmur heard.  Pulmonary/Chest: Breath sounds normal. She has no wheezes. She has no rales.  Neurological: She is alert and oriented to person, place, and time.  Skin: Skin is warm and dry.  Psychiatric: She has a normal mood and affect. Her behavior is normal.             Assessment & Plan:  Hypothyroidism  Will re-order her synthroid today.  Check TSH  Palpitations  EKG today regular NSR  Avoid caffeine.  Check TSH  Counseled if any chest pain,  SOB or loss of consciousness to notify office for appt.  History of dysplasia, endometriosis:  Managed by Dr. Jeanene Erb Gyn  History of anemia  Will check today along with Vitamin B12  See me for CPE or sooner as needed

## 2012-07-12 NOTE — Patient Instructions (Addendum)
Take medicine as prescribed  See me for complete physical

## 2012-07-13 LAB — VITAMIN D 25 HYDROXY (VIT D DEFICIENCY, FRACTURES): Vit D, 25-Hydroxy: 21 ng/mL — ABNORMAL LOW (ref 30–89)

## 2012-07-16 ENCOUNTER — Telehealth: Payer: Self-pay | Admitting: *Deleted

## 2012-07-16 ENCOUNTER — Encounter: Payer: Self-pay | Admitting: *Deleted

## 2012-07-16 ENCOUNTER — Telehealth: Payer: Self-pay | Admitting: Internal Medicine

## 2012-07-16 DIAGNOSIS — D649 Anemia, unspecified: Secondary | ICD-10-CM

## 2012-07-16 MED ORDER — FUSION PLUS PO CAPS: 1.0000 | ORAL_CAPSULE | Freq: Every day | ORAL | Status: AC

## 2012-07-16 NOTE — Telephone Encounter (Signed)
Spoke with  Pt.  She reports having heavy menses and is seeing her GYN this week.  Will start Fusion plus one daily.  And Vitamin D 1000 units daily

## 2012-07-16 NOTE — Telephone Encounter (Signed)
Labs mailed to patient home address

## 2012-07-19 ENCOUNTER — Telehealth: Payer: Self-pay | Admitting: Internal Medicine

## 2012-07-19 NOTE — Telephone Encounter (Signed)
Please send a letter to [facility] saying that it is NECESSARY that the patient have IRON PILLS and it is URGENT...[phone] fax number to the medical insurance company([facility])  About IRON PILLS that were prescribe to pt..  They want her to pay 80 dollars for the pills.... If there are any questions please call pt on her [phone]... ad

## 2012-10-03 ENCOUNTER — Encounter: Payer: BC Managed Care – PPO | Admitting: Internal Medicine

## 2012-10-29 ENCOUNTER — Other Ambulatory Visit: Payer: Self-pay | Admitting: *Deleted

## 2012-10-29 MED ORDER — LEVOTHYROXINE SODIUM 150 MCG PO TABS: 150.0000 ug | ORAL_TABLET | Freq: Every day | ORAL | Status: DC

## 2012-10-29 NOTE — Telephone Encounter (Signed)
Refill request

## 2012-10-29 NOTE — Telephone Encounter (Signed)
Patient took last synthroid this am. Can you please call in a refill to pharmacy [facility].

## 2012-10-29 NOTE — Telephone Encounter (Signed)
Patricia Nolan   I need to see this pt and have her thyroid rechecked.  Give her a 30 min office visit.    Message back with appt time. I reordered her thryoid medication

## 2012-11-06 NOTE — Telephone Encounter (Signed)
Left VM message.

## 2012-12-11 ENCOUNTER — Telehealth: Payer: Self-pay | Admitting: Internal Medicine

## 2012-12-11 ENCOUNTER — Encounter: Payer: BC Managed Care – PPO | Admitting: Internal Medicine

## 2012-12-11 DIAGNOSIS — Z0000 Encounter for general adult medical examination without abnormal findings: Secondary | ICD-10-CM

## 2012-12-11 NOTE — Telephone Encounter (Signed)
[place] and give her a 30 min OV to see me  I note she has did not show for her last two appts (without cancelling) which were for her CPE.  She had significant thyroid abnromalities on her blood work  Advise her that if she does not show without cancelling for a third time in a row I will not longer be able to care for her   Thanks route back to me her appt date and her response

## 2012-12-11 NOTE — Telephone Encounter (Signed)
Left message for pt to return call regarding missed appt an reschedule

## 2013-01-22 ENCOUNTER — Encounter: Payer: Self-pay | Admitting: Internal Medicine

## 2013-01-22 ENCOUNTER — Ambulatory Visit (INDEPENDENT_AMBULATORY_CARE_PROVIDER_SITE_OTHER): Payer: BC Managed Care – PPO | Admitting: Internal Medicine

## 2013-01-22 VITALS — BP 105/60 | HR 70 | Temp 97.3°F | Resp 16 | Wt 230.0 lb

## 2013-01-22 DIAGNOSIS — D649 Anemia, unspecified: Secondary | ICD-10-CM

## 2013-01-22 DIAGNOSIS — E559 Vitamin D deficiency, unspecified: Secondary | ICD-10-CM | POA: Insufficient documentation

## 2013-01-22 DIAGNOSIS — E669 Obesity, unspecified: Secondary | ICD-10-CM

## 2013-01-22 DIAGNOSIS — E039 Hypothyroidism, unspecified: Secondary | ICD-10-CM

## 2013-01-22 DIAGNOSIS — N809 Endometriosis, unspecified: Secondary | ICD-10-CM

## 2013-01-22 LAB — CBC WITH DIFFERENTIAL/PLATELET
Basophils Relative: 0 % (ref 0–1)
HCT: 33.6 % — ABNORMAL LOW (ref 36.0–46.0)
Hemoglobin: 11.3 g/dL — ABNORMAL LOW (ref 12.0–15.0)
Lymphs Abs: 2.5 10*3/uL (ref 0.7–4.0)
MCH: 26.6 pg (ref 26.0–34.0)
MCHC: 33.6 g/dL (ref 30.0–36.0)
Monocytes Absolute: 0.5 10*3/uL (ref 0.1–1.0)
Monocytes Relative: 7 % (ref 3–12)
Neutro Abs: 3.5 10*3/uL (ref 1.7–7.7)
Neutrophils Relative %: 54 % (ref 43–77)
RBC: 4.25 MIL/uL (ref 3.87–5.11)

## 2013-01-22 LAB — TSH: TSH: 4.89 u[IU]/mL — ABNORMAL HIGH (ref 0.350–4.500)

## 2013-01-22 MED ORDER — INTEGRA 62.5-62.5-40-3 MG PO CAPS: 1.0000 | ORAL_CAPSULE | Freq: Every day | ORAL | Status: AC

## 2013-01-22 NOTE — Progress Notes (Signed)
Subjective:    Patient ID: Patricia Nolan, female    DOB: 01/20/1976, 37 y.o.   MRN: [id number]  HPI  Patricia Nolan is here for follow up of her anemia and hypothyroidism  Anemia  She reports she is taking fusion plus but it is expensive.  .  She did see her GYN md who has referred her to an endometriosis specialist at [facility].  She has an appt next week.  Tolerating fusion without GI symptoms  She reports she is taking her daily  Synthroid  She is concerned about her weight.  See TSH   Allergies  Allergen Reactions  . Codeine Anaphylaxis   Past Medical History  Diagnosis Date  . Grave's disease   . Abnormal Pap smear 2002    mild dysplasia  . Endometriosis    Past Surgical History  Procedure Laterality Date  . Thyroidectomy  2011  . Oophorectomy  2006    left ovary  . Cholecystectomy     History   Social History  . Marital Status: Divorced    Spouse Name: N/A    Number of Children: N/A  . Years of Education: N/A   Occupational History  . Not on file.   Social History Main Topics  . Smoking status: Never Smoker   . Smokeless tobacco: Never Used  . Alcohol Use: Yes     Comment: socially  . Drug Use: No  . Sexually Active: No   Other Topics Concern  . Not on file   Social History Narrative  . No narrative on file   Family History  Problem Relation Age of Onset  . Heart disease Mother   . Heart disease Father   . Early death Father    Patient Active Problem List   Diagnosis Date Noted  . Anemia 07/16/2012  . S/P complete thyroidectomy 07/12/2012  . History of Graves' disease 07/12/2012  . Heart palpitations 07/12/2012  . History of non anemic vitamin B12 deficiency 07/12/2012  . SORE THROAT 06/01/2009  . ENDOMETRIOSIS 06/01/2009  . NAUSEA 06/01/2009  . HYPERTHYROIDISM 04/01/2009   Current Outpatient Prescriptions on File Prior to Visit  Medication Sig Dispense Refill  . Cyanocobalamin (VITAMIN B-12 CR PO) Take 1 tablet by mouth daily. Unknown dosage       . dimenhyDRINATE (DRAMAMINE) 50 MG tablet Take 50 mg by mouth every 8 (eight) hours as needed.        Marland Kitchen ibuprofen (ADVIL,MOTRIN) 800 MG tablet Take 800 mg by mouth every 8 (eight) hours as needed.      . Iron-FA-B Cmp-C-Biot-Probiotic (FUSION PLUS) CAPS Take 1 capsule by mouth daily.  90 capsule  1  . levothyroxine (SYNTHROID, LEVOTHROID) 150 MCG tablet Take 1 tablet (150 mcg total) by mouth daily.  90 tablet  0  . naproxen sodium (ALEVE) 220 MG tablet Take 2 tablets twice a day as needed for back and groin pain. Best taken with a meal.      . promethazine (PHENERGAN) 12.5 MG tablet Take 12.5 mg by mouth as needed. For nausea and vomitting        . promethazine (PHENERGAN) 25 MG tablet Take 1 tablet (25 mg total) by mouth every 6 (six) hours as needed for nausea.  30 tablet  0  . Vitamin D, Ergocalciferol, (DRISDOL) 50000 UNITS CAPS Take 50,000 Units by mouth. Unknown dosage per pt 3 x a week      . zolpidem (AMBIEN) 5 MG tablet Take 5 mg by mouth at bedtime  as needed.         No current facility-administered medications on file prior to visit.      Review of Systems    see HPI Objective:   Physical Exam  Physical Exam  Nursing note and vitals reviewed.  Constitutional: She is oriented to person, place, and time. She appears well-developed and well-nourished.  HENT:  Head: Normocephalic and atraumatic.  Cardiovascular: Normal rate and regular rhythm. Exam reveals no gallop and no friction rub.  No murmur heard.  Pulmonary/Chest: Breath sounds normal. She has no wheezes. She has no rales.  Neurological: She is alert and oriented to person, place, and time.  Skin: Skin is warm and dry.  Psychiatric: She has a normal mood and affect. Her behavior is normal.             Assessment & Plan:  Anemia  Will check CBC today  Continue FE will order integra.  GYN eval pending  Hypothyroidism S/P total thyroidectomy    Will check TSH  Obesity:  Discussed that until she is  euthyroid she will have extreme difficulty losing weight.      Low vitamin D  Advised OTC D3

## 2013-01-23 ENCOUNTER — Encounter: Payer: Self-pay | Admitting: *Deleted

## 2013-01-23 ENCOUNTER — Other Ambulatory Visit: Payer: Self-pay | Admitting: Internal Medicine

## 2013-01-23 MED ORDER — LEVOTHYROXINE SODIUM 150 MCG PO TABS: ORAL_TABLET | ORAL | Status: DC

## 2013-01-24 ENCOUNTER — Telehealth: Payer: Self-pay | Admitting: *Deleted

## 2013-01-24 NOTE — Telephone Encounter (Signed)
LVM message regarding medication changes awaiting return call

## 2013-01-24 NOTE — Telephone Encounter (Signed)
Message copied by Mathews Robinsons on Thu Jan 24, 2013  8:11 AM ------      Message from: Raechel Chute D      Created: Wed Jan 23, 2013  8:01 AM       Karen Kitchens            Call pt and let her know that her thyroid function is much better but we need to adjust her dose slightly.  Tell her to take one tablet of her synthroid daily and one and 1/2 tablet on Sunday only.  See me in 2 months ------

## 2013-02-02 ENCOUNTER — Emergency Department (HOSPITAL_BASED_OUTPATIENT_CLINIC_OR_DEPARTMENT_OTHER)
Admission: EM | Admit: 2013-02-02 | Discharge: 2013-02-02 | Discharge status: Against Medical Advice | Payer: BC Managed Care – PPO | Attending: Emergency Medicine | Admitting: Emergency Medicine

## 2013-02-02 ENCOUNTER — Encounter (HOSPITAL_BASED_OUTPATIENT_CLINIC_OR_DEPARTMENT_OTHER): Payer: Self-pay | Admitting: *Deleted

## 2013-02-02 DIAGNOSIS — K921 Melena: Secondary | ICD-10-CM | POA: Insufficient documentation

## 2013-02-02 DIAGNOSIS — R109 Unspecified abdominal pain: Secondary | ICD-10-CM | POA: Insufficient documentation

## 2013-02-02 NOTE — ED Notes (Signed)
Patient states that she hasvurning in her stomach, now having blood in her stool. States that she take a lot of aleve and motrin for migraines. Blood was bright and present only when she wiped.

## 2013-02-06 ENCOUNTER — Telehealth: Payer: Self-pay | Admitting: *Deleted

## 2013-02-06 ENCOUNTER — Ambulatory Visit: Payer: BC Managed Care – PPO | Admitting: Internal Medicine

## 2013-02-06 NOTE — Telephone Encounter (Signed)
Pt rescheduled for Thursday

## 2013-02-07 ENCOUNTER — Encounter (HOSPITAL_BASED_OUTPATIENT_CLINIC_OR_DEPARTMENT_OTHER): Payer: Self-pay | Admitting: Emergency Medicine

## 2013-02-07 ENCOUNTER — Ambulatory Visit (INDEPENDENT_AMBULATORY_CARE_PROVIDER_SITE_OTHER): Payer: BC Managed Care – PPO | Admitting: Internal Medicine

## 2013-02-07 ENCOUNTER — Encounter: Payer: Self-pay | Admitting: Internal Medicine

## 2013-02-07 ENCOUNTER — Emergency Department (HOSPITAL_BASED_OUTPATIENT_CLINIC_OR_DEPARTMENT_OTHER)
Admission: EM | Admit: 2013-02-07 | Discharge: 2013-02-07 | Disposition: A | Discharge status: Home / Self Care | Payer: BC Managed Care – PPO | Attending: Emergency Medicine | Admitting: Emergency Medicine

## 2013-02-07 VITALS — BP 126/84 | HR 85 | Temp 98.0°F | Resp 18 | Wt 228.0 lb

## 2013-02-07 DIAGNOSIS — R519 Headache, unspecified: Secondary | ICD-10-CM | POA: Insufficient documentation

## 2013-02-07 DIAGNOSIS — Z8742 Personal history of other diseases of the female genital tract: Secondary | ICD-10-CM | POA: Insufficient documentation

## 2013-02-07 DIAGNOSIS — Z3202 Encounter for pregnancy test, result negative: Secondary | ICD-10-CM | POA: Insufficient documentation

## 2013-02-07 DIAGNOSIS — R51 Headache: Secondary | ICD-10-CM

## 2013-02-07 DIAGNOSIS — Z79899 Other long term (current) drug therapy: Secondary | ICD-10-CM | POA: Insufficient documentation

## 2013-02-07 DIAGNOSIS — K921 Melena: Secondary | ICD-10-CM

## 2013-02-07 DIAGNOSIS — R0789 Other chest pain: Secondary | ICD-10-CM | POA: Insufficient documentation

## 2013-02-07 DIAGNOSIS — E0500 Thyrotoxicosis with diffuse goiter without thyrotoxic crisis or storm: Secondary | ICD-10-CM | POA: Insufficient documentation

## 2013-02-07 DIAGNOSIS — K2970 Gastritis, unspecified, without bleeding: Secondary | ICD-10-CM

## 2013-02-07 DIAGNOSIS — K625 Hemorrhage of anus and rectum: Secondary | ICD-10-CM | POA: Insufficient documentation

## 2013-02-07 LAB — URINALYSIS, ROUTINE W REFLEX MICROSCOPIC
Glucose, UA: NEGATIVE mg/dL
Ketones, ur: NEGATIVE mg/dL
Leukocytes, UA: NEGATIVE
Nitrite: NEGATIVE
Specific Gravity, Urine: 1.01 (ref 1.005–1.030)
pH: 7 (ref 5.0–8.0)

## 2013-02-07 LAB — COMPREHENSIVE METABOLIC PANEL
BUN: 10 mg/dL (ref 6–23)
Calcium: 9.9 mg/dL (ref 8.4–10.5)
GFR calc Af Amer: >90 mL/min (ref >90)
Glucose, Bld: 94 mg/dL (ref 70–99)
Potassium: 3.7 mEq/L (ref 3.5–5.1)
Sodium: 135 mEq/L (ref 135–145)
Total Protein: 8.2 g/dL (ref 6.0–8.3)

## 2013-02-07 LAB — DIFFERENTIAL
Basophils Absolute: 0 10*3/uL (ref 0.0–0.1)
Eosinophils Absolute: 0.1 10*3/uL (ref 0.0–0.7)
Lymphocytes Relative: 37 % (ref 12–46)
Lymphs Abs: 3.5 10*3/uL (ref 0.7–4.0)
Neutrophils Relative %: 56 % (ref 43–77)

## 2013-02-07 LAB — CBC
Platelets: 248 10*3/uL (ref 150–400)
RBC: 4.38 MIL/uL (ref 3.87–5.11)
RDW: 14.9 % (ref 11.5–15.5)
WBC: 9.5 10*3/uL (ref 4.0–10.5)

## 2013-02-07 LAB — PREGNANCY, URINE: Preg Test, Ur: NEGATIVE

## 2013-02-07 LAB — OCCULT BLOOD X 1 CARD TO LAB, STOOL: Fecal Occult Bld: NEGATIVE

## 2013-02-07 MED ORDER — DIPHENHYDRAMINE HCL 50 MG/ML IJ SOLN
25.0000 mg | Freq: Once | INTRAMUSCULAR | Status: AC
  Administered 2013-02-07: 25 mg via INTRAVENOUS
  Filled 2013-02-07: qty 1

## 2013-02-07 MED ORDER — KETOROLAC TROMETHAMINE 30 MG/ML IJ SOLN
30.0000 mg | Freq: Once | INTRAMUSCULAR | Status: AC
  Administered 2013-02-07: 30 mg via INTRAVENOUS
  Filled 2013-02-07: qty 1

## 2013-02-07 MED ORDER — METOCLOPRAMIDE HCL 5 MG/ML IJ SOLN
10.0000 mg | Freq: Once | INTRAMUSCULAR | Status: AC
  Administered 2013-02-07: 10 mg via INTRAVENOUS
  Filled 2013-02-07 (×2): qty 2

## 2013-02-07 MED ORDER — GI COCKTAIL ~~LOC~~
30.0000 mL | Freq: Once | ORAL | Status: AC
  Administered 2013-02-07: 30 mL via ORAL
  Filled 2013-02-07: qty 30

## 2013-02-07 NOTE — ED Notes (Signed)
Pt states has been taking a lot of aleve and motrin. Pt complaining of headache,nausea and vomiting. Pt states came to ED Saturday and left without being seen. Pt staets went to PCP todayand was referred back here for evaluation of headache and stomach burning with intermittent blood in stool.

## 2013-02-07 NOTE — Progress Notes (Signed)
  Subjective:    Patient ID: Patricia Nolan, female    DOB: [birth year], 37 y.o.   MRN: [id number]  HPI  Patricia Nolan is here for acute visit.   She had been having a "terrible headache for days"  Taking lots of Ibuprofen 4 tabs and Alleve simultaneously for the past several days.  Now has lots of epigastric pain and blood in stoo  Review of Systems See HPI    Objective:   Physical Exam        Assessment & Plan:  Will need to go to er for stat labs and likely injectable meds for headache

## 2013-02-07 NOTE — ED Provider Notes (Signed)
CSN: [id number]     Arrival date & time 02/07/13  1501 History   First MD Initiated Contact with Patient 02/07/13 1645     Chief Complaint  Patient presents with  . Rectal Bleeding  . Headache   (Consider location/radiation/quality/duration/timing/severity/associated sxs/prior Treatment) HPI Comments: Pt states that she was sent here from here pcp as she has been having blood with wiping after her bms:pt states that she is unsure if it was in the toilet or in with stool;pt c/o epigastric burning over the last couple of days:pt states that she has been taking motrin and aleve for the last couple of days because she has a headache and the symptoms started after taking all the medications  The history is provided by the patient. No language interpreter was used.    Past Medical History  Diagnosis Date  . Grave's disease   . Abnormal Pap smear 2002    mild dysplasia  . Endometriosis    Past Surgical History  Procedure Laterality Date  . Thyroidectomy  2011  . Oophorectomy  2006    left ovary  . Cholecystectomy     Family History  Problem Relation Age of Onset  . Heart disease Mother   . Heart disease Father   . Early death Father    History  Substance Use Topics  . Smoking status: Never Smoker   . Smokeless tobacco: Never Used  . Alcohol Use: Yes     Comment: socially   OB History   Grav Para Term Preterm Abortions TAB SAB Ect Mult Living   3 2 2  1  1   2      Review of Systems  Constitutional: Negative.   Respiratory: Negative.   Cardiovascular: Negative.     Allergies  Codeine  Home Medications   Current Outpatient Rx  Name  Route  Sig  Dispense  Refill  . dimenhyDRINATE (DRAMAMINE) 50 MG tablet   Oral   Take 50 mg by mouth every 8 (eight) hours as needed.           . Fe Fum-FePoly-Vit C-Vit B3 (INTEGRA) 62.5-62.5-40-3 MG CAPS   Oral   Take 1 capsule by mouth daily.   30 capsule   5   . ibuprofen (ADVIL,MOTRIN) 800 MG tablet   Oral   Take 800 mg  by mouth every 8 (eight) hours as needed.         . Iron-FA-B Cmp-C-Biot-Probiotic (FUSION PLUS) CAPS   Oral   Take 1 capsule by mouth daily.   90 capsule   1   . levothyroxine (SYNTHROID, LEVOTHROID) 150 MCG tablet      Take one tablet 150 mcg on mon-Sat and one and 1/2 tablet on Sunday   90 tablet   3   . naproxen sodium (ALEVE) 220 MG tablet      Take 2 tablets twice a day as needed for back and groin pain. Best taken with a meal.         . Vitamin D, Ergocalciferol, (DRISDOL) 50000 UNITS CAPS   Oral   Take 50,000 Units by mouth. Unknown dosage per pt 3 x a week          BP 154/87  Pulse 84  Temp(Src) 98.4 F (36.9 C) (Oral)  Resp 18  Ht 5\' 6"  (1.676 m)  Wt 228 lb (103.42 kg)  BMI 36.82 kg/m2  SpO2 100%  LMP 01/22/2013 Physical Exam  Nursing note reviewed. Constitutional: She is oriented to person,  place, and time. She appears well-developed and well-nourished.  HENT:  Head: Normocephalic and atraumatic.  Eyes: Conjunctivae and EOM are normal.  Neck: Normal range of motion. Neck supple.  Cardiovascular: Normal rate and regular rhythm.   Pulmonary/Chest: Effort normal and breath sounds normal.  Abdominal: Soft. Bowel sounds are normal. There is no tenderness.  Genitourinary:  Stool brown in color  Musculoskeletal: Normal range of motion.  Neurological: She is alert and oriented to person, place, and time.  Skin: Skin is warm and dry.    ED Course  Procedures (including critical care time) Labs Review Labs Reviewed  URINALYSIS, ROUTINE W REFLEX MICROSCOPIC - Abnormal; Notable for the following:    APPearance CLOUDY (*)    All other components within normal limits  COMPREHENSIVE METABOLIC PANEL - Abnormal; Notable for the following:    GFR calc non Af Amer 81 (*)    All other components within normal limits  PREGNANCY, URINE  OCCULT BLOOD X 1 CARD TO LAB, STOOL  CBC  DIFFERENTIAL  LIPASE, BLOOD  CBC WITH DIFFERENTIAL   Imaging Review No  results found.  MDM   1. Gastritis   2. Headache    Pt headache has resolved and epigastric symptoms have resolved:instructed pt to stop taking ibuprofen:pt is okay to follow up with pcp for further symptoms    Teressa Lower, NP 02/07/13 2211

## 2013-02-08 NOTE — ED Provider Notes (Signed)
Medical screening examination/treatment/procedure(s) were performed by non-physician practitioner and as supervising physician I was immediately available for consultation/collaboration.  Candyce Churn, MD 02/08/13 1500

## 2013-02-10 ENCOUNTER — Other Ambulatory Visit: Payer: Self-pay | Admitting: Internal Medicine

## 2013-02-12 ENCOUNTER — Other Ambulatory Visit: Payer: Self-pay | Admitting: *Deleted

## 2013-02-12 ENCOUNTER — Encounter: Payer: Self-pay | Admitting: Internal Medicine

## 2013-02-12 ENCOUNTER — Encounter: Payer: Self-pay | Admitting: Gastroenterology

## 2013-02-12 ENCOUNTER — Ambulatory Visit (INDEPENDENT_AMBULATORY_CARE_PROVIDER_SITE_OTHER): Payer: BC Managed Care – PPO | Admitting: Internal Medicine

## 2013-02-12 ENCOUNTER — Encounter: Payer: Self-pay | Admitting: *Deleted

## 2013-02-12 VITALS — BP 116/75 | HR 82 | Temp 98.8°F | Resp 18 | Wt 226.0 lb

## 2013-02-12 DIAGNOSIS — N809 Endometriosis, unspecified: Secondary | ICD-10-CM

## 2013-02-12 DIAGNOSIS — R51 Headache: Secondary | ICD-10-CM

## 2013-02-12 DIAGNOSIS — K2970 Gastritis, unspecified, without bleeding: Secondary | ICD-10-CM

## 2013-02-12 MED ORDER — PANTOPRAZOLE SODIUM 40 MG PO TBEC: 40.0000 mg | DELAYED_RELEASE_TABLET | Freq: Every day | ORAL | Status: DC

## 2013-02-12 MED ORDER — SUMATRIPTAN SUCCINATE 50 MG PO TABS: ORAL_TABLET | ORAL | Status: AC

## 2013-02-12 MED ORDER — LEVOTHYROXINE SODIUM 150 MCG PO TABS: ORAL_TABLET | ORAL | Status: DC

## 2013-02-12 MED ORDER — OMEPRAZOLE 20 MG PO CPDR: 20.0000 mg | DELAYED_RELEASE_CAPSULE | Freq: Every day | ORAL | Status: AC

## 2013-02-12 NOTE — Telephone Encounter (Signed)
Patricia Nolan called over the weekend and left a message that she is out of her Synthroid. She takes 150 mcg daily except Sunday when she takes 1 1/2 tabs. Pt took last pill on Friday 8/29, [facility] gave patient enough to make it through Tuesday 9/2.

## 2013-02-12 NOTE — Telephone Encounter (Signed)
Refill request

## 2013-02-12 NOTE — Progress Notes (Signed)
Subjective:    Patient ID: Patricia Nolan, female    DOB: 12/15/1975, 37 y.o.   MRN: [id number]  HPI  Patricia Nolan is here for ER follow up .  See labs  She has continued to have headaches but tells me she did not get any medication when discharged from ER.    She was guaiac negative and hgb normal  She had headache at work on Saturday associated with nausea,  Photophobia.  She left work early to "sleep in off"  Pain occurs at both temples and behing her eyes.  Mother and sisters both have history of migraines.  Headaches slightly improved today.    She tells me she took An Alleve on Saturday as she had nothing else to take.    She reports no visual on speech changes no numbness or motor weakness  She has had no further blood in stool over the week-end  She would like to change her GYN to someone closer to where she lives.  She reports she has a history of endometriosis  Allergies  Allergen Reactions  . Codeine Anaphylaxis   Past Medical History  Diagnosis Date  . Grave's disease   . Abnormal Pap smear 2002    mild dysplasia  . Endometriosis    Past Surgical History  Procedure Laterality Date  . Thyroidectomy  2011  . Oophorectomy  2006    left ovary  . Cholecystectomy     History   Social History  . Marital Status: Divorced    Spouse Name: N/A    Number of Children: N/A  . Years of Education: N/A   Occupational History  . Not on file.   Social History Main Topics  . Smoking status: Never Smoker   . Smokeless tobacco: Never Used  . Alcohol Use: Yes     Comment: socially  . Drug Use: No  . Sexual Activity: No   Other Topics Concern  . Not on file   Social History Narrative  . No narrative on file   Family History  Problem Relation Age of Onset  . Heart disease Mother   . Heart disease Father   . Early death Father    Patient Active Problem List   Diagnosis Date Noted  . Endometriosis 02/12/2013  . Gastritis 02/12/2013  . Headache(784.0) 02/07/2013  . Blood  in stool 02/07/2013  . Unspecified vitamin D deficiency 01/22/2013  . Hypothyroidism 01/22/2013  . Anemia 07/16/2012  . S/P complete thyroidectomy 07/12/2012  . History of Graves' disease 07/12/2012  . Heart palpitations 07/12/2012  . History of non anemic vitamin B12 deficiency 07/12/2012  . SORE THROAT 06/01/2009  . ENDOMETRIOSIS 06/01/2009  . NAUSEA 06/01/2009  . HYPERTHYROIDISM 04/01/2009   Current Outpatient Prescriptions on File Prior to Visit  Medication Sig Dispense Refill  . dimenhyDRINATE (DRAMAMINE) 50 MG tablet Take 50 mg by mouth every 8 (eight) hours as needed.        . Fe Fum-FePoly-Vit C-Vit B3 (INTEGRA) 62.5-62.5-40-3 MG CAPS Take 1 capsule by mouth daily.  30 capsule  5  . ibuprofen (ADVIL,MOTRIN) 800 MG tablet Take 800 mg by mouth every 8 (eight) hours as needed.      . Iron-FA-B Cmp-C-Biot-Probiotic (FUSION PLUS) CAPS Take 1 capsule by mouth daily.  90 capsule  1  . levothyroxine (SYNTHROID, LEVOTHROID) 150 MCG tablet Take one tablet 150 mcg on mon-Sat and one and 1/2 tablet on Sunday  90 tablet  3  . Vitamin D, Ergocalciferol, (DRISDOL) 50000  UNITS CAPS Take 50,000 Units by mouth. Unknown dosage per pt 3 x a week      . naproxen sodium (ALEVE) 220 MG tablet Take 2 tablets twice a day as needed for back and groin pain. Best taken with a meal.       No current facility-administered medications on file prior to visit.     Review of Systems See HPI    Objective:   Physical Exam Physical Exam  Nursing note and vitals reviewed.  Constitutional: She is oriented to person, place, and time. She appears well-developed and well-nourished.  HENT:  Head: Normocephalic and atraumatic.  Eyes;  No papilledema Cardiovascular: Normal rate and regular rhythm. Exam reveals no gallop and no friction rub.  No murmur heard.  Pulmonary/Chest: Breath sounds normal. She has no wheezes. She has no rales.  Neurological: She is alert and oriented to person, place, and time.   CNII-Xii intact Motor  5/5 UE and LE groups tested Sensory  Intact to microfilament Cerebellar intact FTN Refexes   2+ symmetric   Skin: Skin is warm and dry.  Psychiatric: She has a normal mood and affect. Her behavior is normal.             Assessment & Plan:  Headaches  Neurologic exam nonfocal.    Clinically consistant with migraine syndrome.   Will give Imitrex 50 mg here in office.  She can have extra strenth Tylenol  q6h inbetween if she needs.  Specific instrucitons that she can repeat triptan in 2 hours one time only.    See me in follow up next week  Gastritis  Guaiac neg in ER  Advised to not take any NSAIDS  Will refer to GI.  Will also start omeprazole 20 mg daily  Endoetriosis  Will refer to GYN  See me next week or sooner prn

## 2013-02-12 NOTE — Patient Instructions (Addendum)
Take medicine as ordered   See me next week

## 2013-02-20 ENCOUNTER — Ambulatory Visit: Payer: BC Managed Care – PPO | Admitting: Internal Medicine

## 2013-02-20 DIAGNOSIS — R51 Headache: Secondary | ICD-10-CM

## 2013-02-21 ENCOUNTER — Encounter: Payer: Self-pay | Admitting: Internal Medicine

## 2013-02-21 ENCOUNTER — Ambulatory Visit (INDEPENDENT_AMBULATORY_CARE_PROVIDER_SITE_OTHER): Payer: BC Managed Care – PPO | Admitting: Internal Medicine

## 2013-02-21 VITALS — BP 130/84 | HR 92 | Temp 98.1°F | Resp 18 | Wt 225.0 lb

## 2013-02-21 DIAGNOSIS — D649 Anemia, unspecified: Secondary | ICD-10-CM

## 2013-02-21 DIAGNOSIS — R51 Headache: Secondary | ICD-10-CM

## 2013-02-21 DIAGNOSIS — E039 Hypothyroidism, unspecified: Secondary | ICD-10-CM

## 2013-02-21 MED ORDER — RIZATRIPTAN BENZOATE 10 MG PO TABS: 10.0000 mg | ORAL_TABLET | ORAL | Status: AC | PRN

## 2013-02-21 MED ORDER — BUTALBITAL-APAP-CAFFEINE 50-325-40 MG PO TABS: 1.0000 | ORAL_TABLET | Freq: Two times a day (BID) | ORAL | Status: AC | PRN

## 2013-02-21 MED ORDER — TRAMADOL HCL 50 MG PO TABS: 50.0000 mg | ORAL_TABLET | Freq: Four times a day (QID) | ORAL | Status: DC | PRN

## 2013-02-21 NOTE — Progress Notes (Addendum)
Subjective:    Patient ID: Patricia Nolan, female    DOB: 15-Jul-1975, 37 y.o.   MRN: [id number]  HPI Patricia Nolan is here to follow up on her headaches, her anemia and hypothyroidism.    She says that the Imitrex worked very well but she was only dispensed nine tablets  and she has used all of them.  She is intolerant of codeine  No change in symptoms.  No visual problems, no dysarthria, no paresthesias or muscle weakness.  See hgb  Now in normal range  She tells me she is taking her thyroid meds now   Allergies  Allergen Reactions  . Codeine Anaphylaxis   Past Medical History  Diagnosis Date  . Grave's disease   . Abnormal Pap smear 2002    mild dysplasia  . Endometriosis    Past Surgical History  Procedure Laterality Date  . Thyroidectomy  2011  . Oophorectomy  2006    left ovary  . Cholecystectomy     History   Social History  . Marital Status: Divorced    Spouse Name: N/A    Number of Children: N/A  . Years of Education: N/A   Occupational History  . Not on file.   Social History Main Topics  . Smoking status: Never Smoker   . Smokeless tobacco: Never Used  . Alcohol Use: Yes     Comment: socially  . Drug Use: No  . Sexual Activity: No   Other Topics Concern  . Not on file   Social History Narrative  . No narrative on file   Family History  Problem Relation Age of Onset  . Heart disease Mother   . Heart disease Father   . Early death Father    Patient Active Problem List   Diagnosis Date Noted  . Endometriosis 02/12/2013  . Gastritis 02/12/2013  . Headache(784.0) 02/07/2013  . Blood in stool 02/07/2013  . Unspecified vitamin D deficiency 01/22/2013  . Hypothyroidism 01/22/2013  . Anemia 07/16/2012  . S/P complete thyroidectomy 07/12/2012  . History of Graves' disease 07/12/2012  . Heart palpitations 07/12/2012  . History of non anemic vitamin B12 deficiency 07/12/2012  . SORE THROAT 06/01/2009  . ENDOMETRIOSIS 06/01/2009  . NAUSEA 06/01/2009   . HYPERTHYROIDISM 04/01/2009   Current Outpatient Prescriptions on File Prior to Visit  Medication Sig Dispense Refill  . dimenhyDRINATE (DRAMAMINE) 50 MG tablet Take 50 mg by mouth every 8 (eight) hours as needed.        . Fe Fum-FePoly-Vit C-Vit B3 (INTEGRA) 62.5-62.5-40-3 MG CAPS Take 1 capsule by mouth daily.  30 capsule  5  . ibuprofen (ADVIL,MOTRIN) 800 MG tablet Take 800 mg by mouth every 8 (eight) hours as needed.      . Iron-FA-B Cmp-C-Biot-Probiotic (FUSION PLUS) CAPS Take 1 capsule by mouth daily.  90 capsule  1  . levothyroxine (SYNTHROID, LEVOTHROID) 150 MCG tablet Take one tablet 150 mcg on mon-Sat and one and 1/2 tablet on Sunday  90 tablet  3  . omeprazole (PRILOSEC) 20 MG capsule Take 1 capsule (20 mg total) by mouth daily.  30 capsule  3  . SUMAtriptan (IMITREX) 50 MG tablet Take one at onset of headache .  May repeat in 2 hours one time only  10 tablet  1  . Vitamin D, Ergocalciferol, (DRISDOL) 50000 UNITS CAPS Take 50,000 Units by mouth. Unknown dosage per pt 3 x a week       No current facility-administered medications on file prior  to visit.       Review of Systems See HPI    Objective:   Physical Exam Physical Exam  Nursing note and vitals reviewed.  Constitutional: She is oriented to person, place, and time. She appears well-developed and well-nourished.  HENT:  Head: Normocephalic and atraumatic.  Cardiovascular: Normal rate and regular rhythm. Exam reveals no gallop and no friction rub.  No murmur heard.  Pulmonary/Chest: Breath sounds normal. She has no wheezes. She has no rales.  Neurological: She is alert and oriented to person, place, and time.  Unchanged exam from one week ago Skin: Skin is warm and dry.  Psychiatric: She has a normal mood and affect. Her behavior is normal.              Assessment & Plan:  Headaches:  Clinically consistent with migraine syndrome but with daily use of triptan and frequency of headaches, will have  neurology evaluate.   She cannot tolerate Codeiene and has guaiac positive stool.  She is pending GI eval.  Will give Maxalt to be used as instructed and can have Esgic on a temporary basis.   Anemia  Hgb now at normal range  Hypothyroidism   S/P total thyroidectomy for Graves.  TSH very close to normal will check next visit ' Addendum :   Sent certified letter 04/09/2013  Regarding missed appt with neurologist and for her head CT

## 2013-02-26 ENCOUNTER — Telehealth: Payer: Self-pay | Admitting: Internal Medicine

## 2013-02-26 ENCOUNTER — Telehealth: Payer: Self-pay | Admitting: *Deleted

## 2013-02-26 NOTE — Telephone Encounter (Signed)
Left Message for Patricia Nolan to call Radiology [phone] to schedule her Ct Head at her convenience.

## 2013-02-26 NOTE — Telephone Encounter (Signed)
Spoke with pt.  Still having headaches although much milder on Maxalt.    Will get CT head with contrast  She is awaiting appt with neurologist

## 2013-03-06 ENCOUNTER — Ambulatory Visit: Payer: BC Managed Care – PPO | Admitting: Gastroenterology

## 2013-03-08 ENCOUNTER — Ambulatory Visit (HOSPITAL_BASED_OUTPATIENT_CLINIC_OR_DEPARTMENT_OTHER): Admission: RE | Admit: 2013-03-08 | Payer: BC Managed Care – PPO | Source: Ambulatory Visit

## 2013-03-18 ENCOUNTER — Ambulatory Visit: Payer: BC Managed Care – PPO | Admitting: Neurology

## 2013-03-20 ENCOUNTER — Telehealth: Payer: Self-pay | Admitting: *Deleted

## 2013-03-20 ENCOUNTER — Telehealth: Payer: Self-pay | Admitting: Internal Medicine

## 2013-03-20 NOTE — Telephone Encounter (Signed)
Called both available #'s for [facility] but non answer.  I left her a message that she needed to call and reschedule her appt with CT scan.

## 2013-03-20 NOTE — Telephone Encounter (Signed)
Patricia Nolan called me back. She is unable to have the Ct Scan at this time due to the $240.00 copay required by her insurance.  She plans to see Dr Everlena Cooper and then decide if she needs it.  Also she is due for her 2 month follow up here in late October. She said she will have to call me back later to schedule that appt.

## 2013-03-20 NOTE — Telephone Encounter (Signed)
Heather    I note that Socorro did not have her head CT ordered on 9/26  Please call her and give her the nunmber to imaging downstairs and advise her to reschedule this  Document in chart and route back to me with her response  Thanks

## 2013-04-01 ENCOUNTER — Ambulatory Visit: Payer: BC Managed Care – PPO | Admitting: Neurology

## 2013-04-08 ENCOUNTER — Ambulatory Visit: Payer: BC Managed Care – PPO | Admitting: Gastroenterology

## 2013-04-10 ENCOUNTER — Encounter: Payer: Self-pay | Admitting: Internal Medicine

## 2014-01-19 ENCOUNTER — Other Ambulatory Visit: Payer: Self-pay | Admitting: Internal Medicine

## 2014-01-20 NOTE — Telephone Encounter (Signed)
Requested Medications     Medication name:  Name from pharmacy:  levothyroxine (SYNTHROID, LEVOTHROID) 150 MCG tablet  LEVOTHYROXINE 150 MCG TABLET    Sig: TAKE ONE TABLET 150 MCG ON MON-SAT AND ONE AND 1/2 TABLET ON SUNDAY    Dispense: 90 tablet Refills: 3 Start: 01/19/2014  Class: Normal    Requested on: 02/12/2013    Originally ordered on: 07/12/2012 Last refill: 10/24/2013 Order History and Details

## 2014-01-21 NOTE — Telephone Encounter (Signed)
Patient made appt for 02/12/14 at 8:15

## 2014-01-22 MED ORDER — LEVOTHYROXINE SODIUM 150 MCG PO TABS: ORAL_TABLET | ORAL | Status: AC

## 2014-02-12 ENCOUNTER — Ambulatory Visit: Payer: BC Managed Care – PPO | Admitting: Internal Medicine

## 2014-02-12 DIAGNOSIS — Z09 Encounter for follow-up examination after completed treatment for conditions other than malignant neoplasm: Secondary | ICD-10-CM

## 2014-04-14 ENCOUNTER — Encounter: Payer: Self-pay | Admitting: Internal Medicine

## 2014-08-04 ENCOUNTER — Emergency Department (HOSPITAL_BASED_OUTPATIENT_CLINIC_OR_DEPARTMENT_OTHER)
Admission: EM | Admit: 2014-08-04 | Discharge: 2014-08-04 | Disposition: A | Discharge status: Home / Self Care | Payer: BLUE CROSS/BLUE SHIELD | Attending: Emergency Medicine | Admitting: Emergency Medicine

## 2014-08-04 ENCOUNTER — Emergency Department (HOSPITAL_BASED_OUTPATIENT_CLINIC_OR_DEPARTMENT_OTHER): Payer: BLUE CROSS/BLUE SHIELD

## 2014-08-04 ENCOUNTER — Encounter (HOSPITAL_BASED_OUTPATIENT_CLINIC_OR_DEPARTMENT_OTHER): Payer: Self-pay | Admitting: *Deleted

## 2014-08-04 DIAGNOSIS — R110 Nausea: Secondary | ICD-10-CM | POA: Diagnosis not present

## 2014-08-04 DIAGNOSIS — N83201 Unspecified ovarian cyst, right side: Secondary | ICD-10-CM

## 2014-08-04 DIAGNOSIS — Z79899 Other long term (current) drug therapy: Secondary | ICD-10-CM | POA: Insufficient documentation

## 2014-08-04 DIAGNOSIS — Z8639 Personal history of other endocrine, nutritional and metabolic disease: Secondary | ICD-10-CM | POA: Insufficient documentation

## 2014-08-04 DIAGNOSIS — Z9049 Acquired absence of other specified parts of digestive tract: Secondary | ICD-10-CM | POA: Diagnosis not present

## 2014-08-04 DIAGNOSIS — Z3202 Encounter for pregnancy test, result negative: Secondary | ICD-10-CM | POA: Diagnosis not present

## 2014-08-04 DIAGNOSIS — R102 Pelvic and perineal pain: Secondary | ICD-10-CM

## 2014-08-04 DIAGNOSIS — N8320 Unspecified ovarian cysts: Secondary | ICD-10-CM | POA: Diagnosis not present

## 2014-08-04 DIAGNOSIS — R109 Unspecified abdominal pain: Secondary | ICD-10-CM | POA: Diagnosis present

## 2014-08-04 LAB — URINALYSIS, ROUTINE W REFLEX MICROSCOPIC
BILIRUBIN URINE: NEGATIVE
Glucose, UA: NEGATIVE mg/dL
HGB URINE DIPSTICK: NEGATIVE
KETONES UR: NEGATIVE mg/dL
LEUKOCYTES UA: NEGATIVE
NITRITE: NEGATIVE
PH: 8 (ref 5.0–8.0)
Protein, ur: NEGATIVE mg/dL
Specific Gravity, Urine: 1.014 (ref 1.005–1.030)
Urobilinogen, UA: 1 mg/dL (ref 0.0–1.0)

## 2014-08-04 LAB — CBC WITH DIFFERENTIAL/PLATELET
BASOS ABS: 0 10*3/uL (ref 0.0–0.1)
Basophils Relative: 0 % (ref 0–1)
EOS ABS: 0.1 10*3/uL (ref 0.0–0.7)
Eosinophils Relative: 1 % (ref 0–5)
HCT: 33.9 % — ABNORMAL LOW (ref 36.0–46.0)
HEMOGLOBIN: 10.9 g/dL — AB (ref 12.0–15.0)
LYMPHS PCT: 24 % (ref 12–46)
Lymphs Abs: 2.4 10*3/uL (ref 0.7–4.0)
MCH: 26.7 pg (ref 26.0–34.0)
MCHC: 32.2 g/dL (ref 30.0–36.0)
MCV: 83.1 fL (ref 78.0–100.0)
MONOS PCT: 9 % (ref 3–12)
Monocytes Absolute: 0.8 10*3/uL (ref 0.1–1.0)
Neutro Abs: 6.4 10*3/uL (ref 1.7–7.7)
Neutrophils Relative %: 66 % (ref 43–77)
Platelets: 258 10*3/uL (ref 150–400)
RBC: 4.08 MIL/uL (ref 3.87–5.11)
RDW: 17.5 % — ABNORMAL HIGH (ref 11.5–15.5)
WBC: 9.8 10*3/uL (ref 4.0–10.5)

## 2014-08-04 LAB — COMPREHENSIVE METABOLIC PANEL
ALT: 13 U/L (ref 0–35)
ANION GAP: 2 — AB (ref 5–15)
AST: 25 U/L (ref 0–37)
Albumin: 4.3 g/dL (ref 3.5–5.2)
Alkaline Phosphatase: 49 U/L (ref 39–117)
BUN: 7 mg/dL (ref 6–23)
CALCIUM: 8.4 mg/dL (ref 8.4–10.5)
CHLORIDE: 109 mmol/L (ref 96–112)
CO2: 21 mmol/L (ref 19–32)
Creatinine, Ser: 0.84 mg/dL (ref 0.50–1.10)
GFR calc Af Amer: >90 mL/min (ref >90)
GFR calc non Af Amer: 87 mL/min — ABNORMAL LOW (ref >90)
GLUCOSE: 103 mg/dL — AB (ref 70–99)
POTASSIUM: 4 mmol/L (ref 3.5–5.1)
Sodium: 132 mmol/L — ABNORMAL LOW (ref 135–145)
Total Bilirubin: 0.4 mg/dL (ref 0.3–1.2)
Total Protein: 8.1 g/dL (ref 6.0–8.3)

## 2014-08-04 LAB — PREGNANCY, URINE: Preg Test, Ur: NEGATIVE

## 2014-08-04 MED ORDER — ONDANSETRON HCL 4 MG/2ML IJ SOLN
INTRAMUSCULAR | Status: AC
  Administered 2014-08-04: 4 mg via INTRAVENOUS
  Filled 2014-08-04: qty 2

## 2014-08-04 MED ORDER — HYDROCODONE-ACETAMINOPHEN 5-325 MG PO TABS: 1.0000 | ORAL_TABLET | ORAL | Status: AC | PRN

## 2014-08-04 MED ORDER — SODIUM CHLORIDE 0.9 % IV BOLUS (SEPSIS)
1000.0000 mL | Freq: Once | INTRAVENOUS | Status: AC
  Administered 2014-08-04: 1000 mL via INTRAVENOUS

## 2014-08-04 MED ORDER — ONDANSETRON HCL 4 MG/2ML IJ SOLN
4.0000 mg | Freq: Once | INTRAMUSCULAR | Status: AC
  Administered 2014-08-04: 4 mg via INTRAVENOUS

## 2014-08-04 MED ORDER — PROMETHAZINE HCL 25 MG PO TABS: 25.0000 mg | ORAL_TABLET | Freq: Four times a day (QID) | ORAL | Status: AC | PRN

## 2014-08-04 MED ORDER — PROMETHAZINE HCL 25 MG/ML IJ SOLN
12.5000 mg | Freq: Once | INTRAMUSCULAR | Status: AC
  Administered 2014-08-04: 12.5 mg via INTRAVENOUS
  Filled 2014-08-04: qty 1

## 2014-08-04 MED ORDER — FENTANYL CITRATE 0.05 MG/ML IJ SOLN
50.0000 ug | Freq: Once | INTRAMUSCULAR | Status: AC
  Administered 2014-08-04: 50 ug via INTRAVENOUS
  Filled 2014-08-04: qty 2

## 2014-08-04 MED ORDER — ONDANSETRON HCL 4 MG/2ML IJ SOLN
4.0000 mg | Freq: Once | INTRAMUSCULAR | Status: AC
  Administered 2014-08-04: 4 mg via INTRAVENOUS
  Filled 2014-08-04: qty 2

## 2014-08-04 MED ORDER — KETOROLAC TROMETHAMINE 30 MG/ML IJ SOLN
30.0000 mg | Freq: Once | INTRAMUSCULAR | Status: AC
  Administered 2014-08-04: 30 mg via INTRAVENOUS
  Filled 2014-08-04: qty 1

## 2014-08-04 MED ORDER — HYDROMORPHONE HCL 1 MG/ML IJ SOLN
1.0000 mg | Freq: Once | INTRAMUSCULAR | Status: AC
  Administered 2014-08-04: 1 mg via INTRAVENOUS

## 2014-08-04 MED ORDER — HYDROMORPHONE HCL 1 MG/ML IJ SOLN
INTRAMUSCULAR | Status: AC
  Administered 2014-08-04: 1 mg via INTRAVENOUS
  Filled 2014-08-04: qty 1

## 2014-08-04 NOTE — ED Provider Notes (Signed)
CSN: [id number]     Arrival date & time 08/04/14  1453 History   This chart was scribed for No att. providers found by [facility] Has[facility], ED Scribe. The patient was seen in [facility] and the patient's care was started at 3:27 PM.  Chief Complaint  Patient presents with  . Abdominal Pain   Patient is a 39 y.o. female presenting with abdominal pain. The history is provided by the patient. No language interpreter was used.  Abdominal Pain Associated symptoms: chills and nausea   Associated symptoms: no chest pain, no cough, no diarrhea, no dysuria, no fatigue, no fever, no hematuria, no shortness of breath, no vaginal bleeding, no vaginal discharge and no vomiting     HPI Comments: Patricia Nolan is a 39 y.o. female who presents to the Emergency Department complaining of RLQ abdominal pain that radiates down to her rectum. For the past two days the pain was intermittent and initially she thought it was gas, so she took tums for no relief. Then last night the pain gradually intensified which she describes as constant labor pain. Today she called her doctor and went to the urgent care and told to come to the ED for possible appendicitis. She has chills and nausea as associated symptoms. Pt has had a cholecystectomy. No other recent illness. She denies dysuria, diarrhea, fever, vaginal bleeding or vaginal discharge.  Past Medical History  Diagnosis Date  . Grave's disease   . Abnormal Pap smear 2002    mild dysplasia  . Endometriosis    Past Surgical History  Procedure Laterality Date  . Thyroidectomy  2011  . Oophorectomy  2006    left ovary  . Cholecystectomy     Family History  Problem Relation Age of Onset  . Heart disease Mother   . Heart disease Father   . Early death Father    History  Substance Use Topics  . Smoking status: Never Smoker   . Smokeless tobacco: Never Used  . Alcohol Use: Yes     Comment: socially   OB History    Gravida Para Term Preterm AB TAB SAB Ectopic  Multiple Living   3 2 2  1  1   2      Review of Systems  Constitutional: Positive for chills. Negative for fever, diaphoresis and fatigue.  HENT: Negative for congestion, rhinorrhea and sneezing.   Eyes: Negative.   Respiratory: Negative for cough, chest tightness and shortness of breath.   Cardiovascular: Negative for chest pain and leg swelling.  Gastrointestinal: Positive for nausea and abdominal pain. Negative for vomiting, diarrhea and blood in stool.  Genitourinary: Negative for dysuria, frequency, hematuria, flank pain, vaginal bleeding, vaginal discharge and difficulty urinating.  Musculoskeletal: Negative for back pain and arthralgias.  Skin: Negative for rash.  Neurological: Negative for dizziness, speech difficulty, weakness, numbness and headaches.    Allergies  Codeine  Home Medications   Prior to Admission medications   Medication Sig Start Date End Date Taking? Authorizing Provider  butalbital-acetaminophen-caffeine (ESGIC) 50-325-40 MG per tablet Take 1 tablet by mouth 2 (two) times daily as needed for headache. 02/21/13   Kendrick Rancheborah D Schoenhoff, MD  dimenhyDRINATE (DRAMAMINE) 50 MG tablet Take 50 mg by mouth every 8 (eight) hours as needed.      Historical Provider, MD  Fe Fum-FePoly-Vit C-Vit B3 (INTEGRA) 62.5-62.5-40-3 MG CAPS Take 1 capsule by mouth daily. 01/22/13   Kendrick Rancheborah D Schoenhoff, MD  HYDROcodone-acetaminophen (NORCO/VICODIN) 5-325 MG per tablet Take 1-2 tablets  by mouth every 4 (four) hours as needed. 08/04/14   Rolan Bucco, MD  ibuprofen (ADVIL,MOTRIN) 800 MG tablet Take 800 mg by mouth every 8 (eight) hours as needed.    Historical Provider, MD  Iron-FA-B Cmp-C-Biot-Probiotic (FUSION PLUS) CAPS Take 1 capsule by mouth daily. 07/16/12   Kendrick Ranch, MD  levothyroxine (SYNTHROID, LEVOTHROID) 150 MCG tablet Take one tablet mon-fRiday and one and 1/2 tablet on Saturday and sunday 01/22/14   Kendrick Ranch, MD  omeprazole (PRILOSEC) 20 MG capsule Take  1 capsule (20 mg total) by mouth daily. 02/12/13   Kendrick Ranch, MD  promethazine (PHENERGAN) 25 MG tablet Take 1 tablet (25 mg total) by mouth every 6 (six) hours as needed for nausea or vomiting. 08/04/14   Rolan Bucco, MD  rizatriptan (MAXALT) 10 MG tablet Take 1 tablet (10 mg total) by mouth as needed for migraine. May repeat in 2 hours if needed 02/21/13   Kendrick Ranch, MD  SUMAtriptan (IMITREX) 50 MG tablet Take one at onset of headache .  May repeat in 2 hours one time only 02/12/13   Kendrick Ranch, MD  Vitamin D, Ergocalciferol, (DRISDOL) 50000 UNITS CAPS Take 50,000 Units by mouth. Unknown dosage per pt 3 x a week    Historical Provider, MD   BP 108/57 mmHg  Pulse 88  Temp(Src) 98.5 F (36.9 C)  Resp 16  Ht 5\' 5"  (1.651 m)  Wt 196 lb (88.905 kg)  BMI 32.62 kg/m2  SpO2 100%  LMP 07/26/2014 Physical Exam  Constitutional: She is oriented to person, place, and time. She appears well-developed and well-nourished.  HENT:  Head: Normocephalic and atraumatic.  Eyes: Pupils are equal, round, and reactive to light.  Neck: Normal range of motion. Neck supple.  Cardiovascular: Normal rate, regular rhythm and normal heart sounds.   Pulmonary/Chest: Effort normal and breath sounds normal. No respiratory distress. She has no wheezes. She has no rales. She exhibits no tenderness.  Abdominal: Soft. Bowel sounds are normal. There is tenderness. There is guarding.  Marked tenderness right lower pelvic area with guarding. No pain over McBurney's point, no CVA tenderness.  Genitourinary:  Pelvic exam with marked right adnexal tenderness.  Small amount of white discharge.  No CMT.  Musculoskeletal: Normal range of motion. She exhibits no edema.  Lymphadenopathy:    She has no cervical adenopathy.  Neurological: She is alert and oriented to person, place, and time.  Skin: Skin is warm and dry. No rash noted.  Psychiatric: She has a normal mood and affect.    ED Course   Procedures  DIAGNOSTIC STUDIES: Oxygen Saturation is 100% on room air, normal by my interpretation.    COORDINATION OF CARE: 3:35 PM Discussed treatment plan with pt at bedside and pt agreed to plan.  Labs Review Labs Reviewed  COMPREHENSIVE METABOLIC PANEL - Abnormal; Notable for the following:    Sodium 132 (*)    Glucose, Bld 103 (*)    GFR calc non Af Amer 87 (*)    Anion gap 2 (*)    All other components within normal limits  CBC WITH DIFFERENTIAL/PLATELET - Abnormal; Notable for the following:    Hemoglobin 10.9 (*)    HCT 33.9 (*)    RDW 17.5 (*)    All other components within normal limits  URINALYSIS, ROUTINE W REFLEX MICROSCOPIC  PREGNANCY, URINE   Imaging Review US Transvaginal Non-ob  08/04/2014   CLINICAL DATA:  Acute onset right sided pelvic  pain approximately 12 hours ago. Previous left oophorectomy for endometriosis. LMP 07/29/2014.  EXAM: TRANSABDOMINAL AND TRANSVAGINAL ULTRASOUND OF PELVIS  DOPPLER ULTRASOUND OF OVARIES  TECHNIQUE: Both transabdominal and transvaginal ultrasound examinations of the pelvis were performed. Transabdominal technique was performed for global imaging of the pelvis including uterus, ovaries, adnexal regions, and pelvic cul-de-sac.  It was necessary to proceed with endovaginal exam following the transabdominal exam to visualize the endometrium and ovaries. Color and duplex Doppler ultrasound was utilized to evaluate blood flow to the ovaries.  COMPARISON:  12/24/2010  FINDINGS: Uterus  Measurements: 8.4 x 5.1 x 6.0 cm. Diffusely heterogeneous echotexture if uterine myometrium noted, but no distinct fibroids visualized. This is nonspecific but may be related to adenomyosis.  Endometrium  Thickness: 8 mm.  No focal abnormality visualized.  Right ovary  Measurements: 5.1 x 4.8 x 3.9 cm. A complex cystic lesion is seen which measures approximately 3.2 x 2.6 by 3.0 cm. This contains a hypoechoic mural nodular density which measures approximately 1.9  cm but shows no internal blood flow on color Doppler ultrasound. This may represent an atypical hemorrhagic cyst, although a cystic ovarian neoplasm cannot be excluded.  Left ovary  Measurements: Surgically absent. No adnexal mass identified.  Pulsed Doppler evaluation of the right ovary demonstrates normal low-resistance arterial and venous waveforms.  Other findings  No free fluid.  IMPRESSION: 3.2 cm indeterminate complex right ovarian cyst. Differential diagnosis includes an atypical hemorrhagic cyst and cystic ovarian neoplasm. Followup by pelvic ultrasound is recommended in 6-12 weeks.  No sonographic evidence for right ovarian torsion.  Previous left oophorectomy.  Diffusely heterogeneous myometrial echotexture, which is nonspecific but suspicious for uterine adenomyosis.   Electronically Signed   By: Myles Rosenthal M.D.   On: 08/04/2014 18:04   US Pelvis Complete  08/04/2014   CLINICAL DATA:  Acute onset right sided pelvic pain approximately 12 hours ago. Previous left oophorectomy for endometriosis. LMP 07/29/2014.  EXAM: TRANSABDOMINAL AND TRANSVAGINAL ULTRASOUND OF PELVIS  DOPPLER ULTRASOUND OF OVARIES  TECHNIQUE: Both transabdominal and transvaginal ultrasound examinations of the pelvis were performed. Transabdominal technique was performed for global imaging of the pelvis including uterus, ovaries, adnexal regions, and pelvic cul-de-sac.  It was necessary to proceed with endovaginal exam following the transabdominal exam to visualize the endometrium and ovaries. Color and duplex Doppler ultrasound was utilized to evaluate blood flow to the ovaries.  COMPARISON:  12/24/2010  FINDINGS: Uterus  Measurements: 8.4 x 5.1 x 6.0 cm. Diffusely heterogeneous echotexture if uterine myometrium noted, but no distinct fibroids visualized. This is nonspecific but may be related to adenomyosis.  Endometrium  Thickness: 8 mm.  No focal abnormality visualized.  Right ovary  Measurements: 5.1 x 4.8 x 3.9 cm. A complex  cystic lesion is seen which measures approximately 3.2 x 2.6 by 3.0 cm. This contains a hypoechoic mural nodular density which measures approximately 1.9 cm but shows no internal blood flow on color Doppler ultrasound. This may represent an atypical hemorrhagic cyst, although a cystic ovarian neoplasm cannot be excluded.  Left ovary  Measurements: Surgically absent. No adnexal mass identified.  Pulsed Doppler evaluation of the right ovary demonstrates normal low-resistance arterial and venous waveforms.  Other findings  No free fluid.  IMPRESSION: 3.2 cm indeterminate complex right ovarian cyst. Differential diagnosis includes an atypical hemorrhagic cyst and cystic ovarian neoplasm. Followup by pelvic ultrasound is recommended in 6-12 weeks.  No sonographic evidence for right ovarian torsion.  Previous left oophorectomy.  Diffusely heterogeneous myometrial echotexture, which  is nonspecific but suspicious for uterine adenomyosis.   Electronically Signed   By: Myles Rosenthal M.D.   On: 08/04/2014 18:04   [place] Art/ven Flow Abd Pelv Doppler  08/04/2014   CLINICAL DATA:  Acute onset right sided pelvic pain approximately 12 hours ago. Previous left oophorectomy for endometriosis. LMP 07/29/2014.  EXAM: TRANSABDOMINAL AND TRANSVAGINAL ULTRASOUND OF PELVIS  DOPPLER ULTRASOUND OF OVARIES  TECHNIQUE: Both transabdominal and transvaginal ultrasound examinations of the pelvis were performed. Transabdominal technique was performed for global imaging of the pelvis including uterus, ovaries, adnexal regions, and pelvic cul-de-sac.  It was necessary to proceed with endovaginal exam following the transabdominal exam to visualize the endometrium and ovaries. Color and duplex Doppler ultrasound was utilized to evaluate blood flow to the ovaries.  COMPARISON:  12/24/2010  FINDINGS: Uterus  Measurements: 8.4 x 5.1 x 6.0 cm. Diffusely heterogeneous echotexture if uterine myometrium noted, but no distinct fibroids visualized. This is  nonspecific but may be related to adenomyosis.  Endometrium  Thickness: 8 mm.  No focal abnormality visualized.  Right ovary  Measurements: 5.1 x 4.8 x 3.9 cm. A complex cystic lesion is seen which measures approximately 3.2 x 2.6 by 3.0 cm. This contains a hypoechoic mural nodular density which measures approximately 1.9 cm but shows no internal blood flow on color Doppler ultrasound. This may represent an atypical hemorrhagic cyst, although a cystic ovarian neoplasm cannot be excluded.  Left ovary  Measurements: Surgically absent. No adnexal mass identified.  Pulsed Doppler evaluation of the right ovary demonstrates normal low-resistance arterial and venous waveforms.  Other findings  No free fluid.  IMPRESSION: 3.2 cm indeterminate complex right ovarian cyst. Differential diagnosis includes an atypical hemorrhagic cyst and cystic ovarian neoplasm. Followup by pelvic ultrasound is recommended in 6-12 weeks.  No sonographic evidence for right ovarian torsion.  Previous left oophorectomy.  Diffusely heterogeneous myometrial echotexture, which is nonspecific but suspicious for uterine adenomyosis.   Electronically Signed   By: Myles Rosenthal M.D.   On: 08/04/2014 18:04     EKG Interpretation None      MDM   Final diagnoses:  Cyst of right ovary   PT feeling better after pain meds in the ED.  Her pain seems to be pelvic in nature.  Doubt appendicitis.  No fever.  No WBC.  +ovarian cyst which is likely origin of pain.  Advised pt that the cyst is complex and needs a repeat u/s by her ob/gyn to reassess within the next 2 weeks, or sooner if her symptoms are not improving.  Given rx for vicodin for pain.  Pt says that she cannot take percocet.  Advised that she needs to return if she has worsening pain, vomiting, or fevers.  I personally performed the services described in this documentation, which was scribed in my presence.  The recorded information has been reviewed and considered.      Rolan Bucco, MD 08/05/14 0000

## 2014-08-04 NOTE — ED Notes (Signed)
Attempt x 1 at piv- pt with poor vein selection- Edwin DadaSteve Cothren, RT and Cordie Griceuth Marcum, RN at bedside to attempt

## 2014-08-04 NOTE — Discharge Instructions (Signed)
Ovarian Cyst An ovarian cyst is a fluid-filled sac that forms on an ovary. The ovaries are small organs that produce eggs in women. Various types of cysts can form on the ovaries. Most are not cancerous. Many do not cause problems, and they often go away on their own. Some may cause symptoms and require treatment. Common types of ovarian cysts include:  Functional cysts--These cysts may occur every month during the menstrual cycle. This is normal. The cysts usually go away with the next menstrual cycle if the woman does not get pregnant. Usually, there are no symptoms with a functional cyst.  Endometrioma cysts--These cysts form from the tissue that lines the uterus. They are also called "chocolate cysts" because they become filled with blood that turns brown. This type of cyst can cause pain in the lower abdomen during intercourse and with your menstrual period.  Cystadenoma cysts--This type develops from the cells on the outside of the ovary. These cysts can get very big and cause lower abdomen pain and pain with intercourse. This type of cyst can twist on itself, cut off its blood supply, and cause severe pain. It can also easily rupture and cause a lot of pain.  Dermoid cysts--This type of cyst is sometimes found in both ovaries. These cysts may contain different kinds of body tissue, such as skin, teeth, hair, or cartilage. They usually do not cause symptoms unless they get very big.  Theca lutein cysts--These cysts occur when too much of a certain hormone (human chorionic gonadotropin) is produced and overstimulates the ovaries to produce an egg. This is most common after procedures used to assist with the conception of a baby (in vitro fertilization). CAUSES   Fertility drugs can cause a condition in which multiple large cysts are formed on the ovaries. This is called ovarian hyperstimulation syndrome.  A condition called polycystic ovary syndrome can cause hormonal imbalances that can lead to  nonfunctional ovarian cysts. SIGNS AND SYMPTOMS  Many ovarian cysts do not cause symptoms. If symptoms are present, they may include:  Pelvic pain or pressure.  Pain in the lower abdomen.  Pain during sexual intercourse.  Increasing girth (swelling) of the abdomen.  Abnormal menstrual periods.  Increasing pain with menstrual periods.  Stopping having menstrual periods without being pregnant. DIAGNOSIS  These cysts are commonly found during a routine or annual pelvic exam. Tests may be ordered to find out more about the cyst. These tests may include:  Ultrasound.  X-ray of the pelvis.  CT scan.  MRI.  Blood tests. TREATMENT  Many ovarian cysts go away on their own without treatment. Your health care provider may want to check your cyst regularly for 2-3 months to see if it changes. For women in menopause, it is particularly important to monitor a cyst closely because of the higher rate of ovarian cancer in menopausal women. When treatment is needed, it may include any of the following:  A procedure to drain the cyst (aspiration). This may be done using a long needle and ultrasound. It can also be done through a laparoscopic procedure. This involves using a thin, lighted tube with a tiny camera on the end (laparoscope) inserted through a small incision.  Surgery to remove the whole cyst. This may be done using laparoscopic surgery or an open surgery involving a larger incision in the lower abdomen.  Hormone treatment or birth control pills. These methods are sometimes used to help dissolve a cyst. HOME CARE INSTRUCTIONS   Only take over-the-counter   or prescription medicines as directed by your health care provider.  Follow up with your health care provider as directed.  Get regular pelvic exams and Pap tests. SEEK MEDICAL CARE IF:   Your periods are late, irregular, or painful, or they stop.  Your pelvic pain or abdominal pain does not go away.  Your abdomen becomes  larger or swollen.  You have pressure on your bladder or trouble emptying your bladder completely.  You have pain during sexual intercourse.  You have feelings of fullness, pressure, or discomfort in your stomach.  You lose weight for no apparent reason.  You feel generally ill.  You become constipated.  You lose your appetite.  You develop acne.  You have an increase in body and facial hair.  You are gaining weight, without changing your exercise and eating habits.  You think you are pregnant. SEEK IMMEDIATE MEDICAL CARE IF:   You have increasing abdominal pain.  You feel sick to your stomach (nauseous), and you throw up (vomit).  You develop a fever that comes on suddenly.  You have abdominal pain during a bowel movement.  Your menstrual periods become heavier than usual. MAKE SURE YOU:  Understand these instructions.  Will watch your condition.  Will get help right away if you are not doing well or get worse. Document Released: 05/30/2005 Document Revised: 06/04/2013 Document Reviewed: 02/04/2013 [employer] Patient Information 2015 [employer]. This information is not intended to replace advice given to you by your health care provider. Make sure you discuss any questions you have with your health care provider.  

## 2014-08-04 NOTE — ED Notes (Signed)
[facility]  , pt c/o right lower abd pain x 2 days

## 2014-08-04 NOTE — ED Notes (Signed)
MD at bedside. 

## 2014-08-04 NOTE — ED Notes (Signed)
Spoke with pts sister on the phone per pt request.  Sister expressing anger that pts pain med had not been changed since it is "not giving her any relief".  I spoke with pt and she confirmed that she has not gotten much relief from her pain.  I spoke with MD and notified her that the pt and her sister are requesting a different pain med.

## 2014-08-04 NOTE — ED Notes (Signed)
Patient transported to Ultrasound 

## 2014-08-04 NOTE — ED Notes (Signed)
MD at bedside discussing dispo plan of care. 

## 2014-08-04 NOTE — ED Notes (Signed)
US notified pt ready, bladder is full
